# Patient Record
Sex: Female | Born: 1965 | Race: White | Hispanic: Yes | Marital: Married | Smoking: Never smoker
Health system: Southern US, Community
[De-identification: ages and names within clinical notes are randomized; demographics above are authoritative.]

## PROBLEM LIST (undated history)

## (undated) DIAGNOSIS — E785 Hyperlipidemia, unspecified: Secondary | ICD-10-CM

## (undated) DIAGNOSIS — G4700 Insomnia, unspecified: Secondary | ICD-10-CM

## (undated) DIAGNOSIS — D219 Benign neoplasm of connective and other soft tissue, unspecified: Secondary | ICD-10-CM

## (undated) DIAGNOSIS — E039 Hypothyroidism, unspecified: Secondary | ICD-10-CM

## (undated) HISTORY — DX: Benign neoplasm of connective and other soft tissue, unspecified: D21.9

## (undated) HISTORY — PX: DILATION AND CURETTAGE OF UTERUS: SHX78

## (undated) HISTORY — DX: Hypothyroidism, unspecified: E03.9

---

## 2000-03-28 ENCOUNTER — Other Ambulatory Visit: Admission: RE | Admit: 2000-03-28 | Discharge: 2000-03-28 | Payer: Self-pay | Admitting: Gynecology

## 2000-03-30 ENCOUNTER — Encounter: Payer: Self-pay | Admitting: Gynecology

## 2000-03-30 ENCOUNTER — Ambulatory Visit (HOSPITAL_COMMUNITY): Admission: RE | Admit: 2000-03-30 | Discharge: 2000-03-30 | Payer: Self-pay | Admitting: Gynecology

## 2000-04-01 ENCOUNTER — Encounter: Payer: Self-pay | Admitting: Gynecology

## 2000-04-01 ENCOUNTER — Ambulatory Visit (HOSPITAL_COMMUNITY): Admission: RE | Admit: 2000-04-01 | Discharge: 2000-04-01 | Payer: Self-pay | Admitting: Gynecology

## 2001-04-06 ENCOUNTER — Other Ambulatory Visit: Admission: RE | Admit: 2001-04-06 | Discharge: 2001-04-06 | Payer: Self-pay | Admitting: Gynecology

## 2001-10-31 ENCOUNTER — Emergency Department (HOSPITAL_COMMUNITY): Admission: EM | Admit: 2001-10-31 | Discharge: 2001-11-01 | Payer: Self-pay | Admitting: Emergency Medicine

## 2001-11-01 ENCOUNTER — Encounter: Payer: Self-pay | Admitting: Emergency Medicine

## 2002-06-16 ENCOUNTER — Encounter (INDEPENDENT_AMBULATORY_CARE_PROVIDER_SITE_OTHER): Payer: Self-pay | Admitting: *Deleted

## 2002-06-16 LAB — CONVERTED CEMR LAB

## 2002-12-13 ENCOUNTER — Other Ambulatory Visit: Admission: RE | Admit: 2002-12-13 | Discharge: 2002-12-13 | Payer: Self-pay | Admitting: Gynecology

## 2003-03-14 ENCOUNTER — Emergency Department (HOSPITAL_COMMUNITY): Admission: EM | Admit: 2003-03-14 | Discharge: 2003-03-14 | Payer: Self-pay | Admitting: Emergency Medicine

## 2003-08-26 ENCOUNTER — Encounter: Admission: RE | Admit: 2003-08-26 | Discharge: 2003-08-26 | Payer: Self-pay | Admitting: Sports Medicine

## 2003-08-29 ENCOUNTER — Encounter: Admission: RE | Admit: 2003-08-29 | Discharge: 2003-08-29 | Payer: Self-pay | Admitting: Family Medicine

## 2003-09-09 ENCOUNTER — Encounter: Admission: RE | Admit: 2003-09-09 | Discharge: 2003-09-09 | Payer: Self-pay | Admitting: Family Medicine

## 2004-09-18 ENCOUNTER — Other Ambulatory Visit: Admission: RE | Admit: 2004-09-18 | Discharge: 2004-09-18 | Payer: Self-pay | Admitting: Gynecology

## 2004-10-08 ENCOUNTER — Encounter: Admission: RE | Admit: 2004-10-08 | Discharge: 2004-10-08 | Payer: Self-pay | Admitting: Gynecology

## 2004-12-24 ENCOUNTER — Emergency Department (HOSPITAL_COMMUNITY): Admission: EM | Admit: 2004-12-24 | Discharge: 2004-12-24 | Payer: Self-pay | Admitting: Emergency Medicine

## 2006-04-14 DIAGNOSIS — E669 Obesity, unspecified: Secondary | ICD-10-CM | POA: Insufficient documentation

## 2006-04-14 DIAGNOSIS — E739 Lactose intolerance, unspecified: Secondary | ICD-10-CM | POA: Insufficient documentation

## 2006-04-15 ENCOUNTER — Encounter (INDEPENDENT_AMBULATORY_CARE_PROVIDER_SITE_OTHER): Payer: Self-pay | Admitting: *Deleted

## 2009-01-03 ENCOUNTER — Ambulatory Visit (HOSPITAL_COMMUNITY): Admission: RE | Admit: 2009-01-03 | Discharge: 2009-01-03 | Payer: Self-pay | Admitting: Obstetrics & Gynecology

## 2010-02-17 ENCOUNTER — Ambulatory Visit (HOSPITAL_COMMUNITY)
Admission: RE | Admit: 2010-02-17 | Discharge: 2010-02-17 | Payer: Self-pay | Source: Home / Self Care | Attending: Obstetrics & Gynecology | Admitting: Obstetrics & Gynecology

## 2011-05-05 ENCOUNTER — Other Ambulatory Visit (HOSPITAL_COMMUNITY): Payer: Self-pay | Admitting: Geriatric Medicine

## 2011-05-05 DIAGNOSIS — Z1231 Encounter for screening mammogram for malignant neoplasm of breast: Secondary | ICD-10-CM

## 2011-06-03 ENCOUNTER — Ambulatory Visit (HOSPITAL_COMMUNITY)
Admission: RE | Admit: 2011-06-03 | Discharge: 2011-06-03 | Disposition: A | Discharge status: Home / Self Care | Payer: Self-pay | Source: Ambulatory Visit | Attending: Geriatric Medicine | Admitting: Geriatric Medicine

## 2011-06-03 DIAGNOSIS — Z1231 Encounter for screening mammogram for malignant neoplasm of breast: Secondary | ICD-10-CM

## 2011-06-07 ENCOUNTER — Other Ambulatory Visit: Payer: Self-pay | Admitting: Geriatric Medicine

## 2011-06-07 DIAGNOSIS — R928 Other abnormal and inconclusive findings on diagnostic imaging of breast: Secondary | ICD-10-CM

## 2011-07-02 ENCOUNTER — Ambulatory Visit
Admission: RE | Admit: 2011-07-02 | Discharge: 2011-07-02 | Disposition: A | Discharge status: Home / Self Care | Payer: Self-pay | Source: Ambulatory Visit | Attending: Geriatric Medicine | Admitting: Geriatric Medicine

## 2011-07-02 DIAGNOSIS — R928 Other abnormal and inconclusive findings on diagnostic imaging of breast: Secondary | ICD-10-CM

## 2012-01-03 ENCOUNTER — Emergency Department (HOSPITAL_COMMUNITY)
Admission: EM | Admit: 2012-01-03 | Discharge: 2012-01-03 | Disposition: A | Discharge status: Home / Self Care | Payer: Self-pay | Attending: Emergency Medicine | Admitting: Emergency Medicine

## 2012-01-03 ENCOUNTER — Encounter (HOSPITAL_COMMUNITY): Payer: Self-pay | Admitting: Adult Health

## 2012-01-03 DIAGNOSIS — N390 Urinary tract infection, site not specified: Secondary | ICD-10-CM | POA: Insufficient documentation

## 2012-01-03 DIAGNOSIS — R109 Unspecified abdominal pain: Secondary | ICD-10-CM | POA: Insufficient documentation

## 2012-01-03 LAB — URINE MICROSCOPIC-ADD ON

## 2012-01-03 LAB — URINALYSIS, ROUTINE W REFLEX MICROSCOPIC
Glucose, UA: NEGATIVE mg/dL
Protein, ur: 100 mg/dL — AB

## 2012-01-03 MED ORDER — PHENAZOPYRIDINE HCL 200 MG PO TABS: 200.0000 mg | ORAL_TABLET | Freq: Three times a day (TID) | ORAL | Status: DC

## 2012-01-03 MED ORDER — NITROFURANTOIN MONOHYD MACRO 100 MG PO CAPS: 100.0000 mg | ORAL_CAPSULE | Freq: Two times a day (BID) | ORAL | Status: DC

## 2012-01-03 NOTE — ED Notes (Addendum)
Presents with burning with urination, lower abdominal pain that feels like a pressure, frequent urination, inability to empty bladder fully. Denies hematuria. Urinary symptoms began Friday. Denies discharge, foul odor.  Pain is worse when standing up and is better when sitting.

## 2012-01-03 NOTE — ED Provider Notes (Signed)
History   This chart was scribed for Loren Racer, MD by Gerlean Ren, ED Scribe. This patient was seen in room TR08C/TR08C and the patient's care was started at 7:55 PM    CSN: [id number]  Arrival date & time 01/03/12  1756   First MD Initiated Contact with Patient 01/03/12 1946      Chief Complaint  Patient presents with  . Dysuria    (Consider location/radiation/quality/duration/timing/severity/associated sxs/prior treatment) The history is provided by the patient and a relative. A language interpreter was used.   Dawn Villegas is a 46 y.o. female who presents to the Emergency Department complaining of 3 days of dysuria with associated lower abdominal pain that has been worsening since this morning causing pt to leave work.  Pt denies hematuria, urgency, frequency, fever and chills as associated symptoms.  Pt reports h/o UTI "long time ago."  Pt denies tobacco and alcohol use.   History reviewed. No pertinent past medical history.  History reviewed. No pertinent past surgical history.  History reviewed. No pertinent family history.  History  Substance Use Topics  . Smoking status: Never Smoker   . Smokeless tobacco: Not on file  . Alcohol Use: No    No OB history provided.  Review of Systems  Constitutional: Negative for fever and chills.  Gastrointestinal: Negative for nausea, vomiting and abdominal pain.  Genitourinary: Positive for dysuria. Negative for frequency, hematuria, flank pain, decreased urine volume and vaginal discharge.    Allergies  Review of patient's allergies indicates no known allergies.  Home Medications   Current Outpatient Rx  Name  Route  Sig  Dispense  Refill  . NITROFURANTOIN MONOHYD MACRO 100 MG PO CAPS   Oral   Take 1 capsule (100 mg total) by mouth 2 (two) times daily.   10 capsule   0   . PHENAZOPYRIDINE HCL 200 MG PO TABS   Oral   Take 1 tablet (200 mg total) by mouth 3 (three) times daily.   6 tablet   0     BP  116/67  Pulse 81  Temp 98.8 F (37.1 C) (Oral)  Resp 16  SpO2 99%  Physical Exam  Nursing note and vitals reviewed. Constitutional: She is oriented to person, place, and time. She appears well-developed and well-nourished.  HENT:  Head: Normocephalic and atraumatic.  Eyes: Conjunctivae normal and EOM are normal. Pupils are equal, round, and reactive to light.  Neck: Normal range of motion. Neck supple.  Cardiovascular: Normal rate, regular rhythm and normal heart sounds.   Pulmonary/Chest: Effort normal and breath sounds normal.  Abdominal: Soft. Bowel sounds are normal. She exhibits no distension.       Mild suprapubic tenderness.  Musculoskeletal: Normal range of motion.       Mild bilateral paraspinal tenderness.  No CVA tenderness.  Neurological: She is alert and oriented to person, place, and time.  Skin: Skin is warm and dry.  Psychiatric: She has a normal mood and affect.    ED Course  Procedures (including critical care time) DIAGNOSTIC STUDIES: Oxygen Saturation is 99% on room air, normal by my interpretation.    COORDINATION OF CARE: 7:57 PM- Patient informed of clinical course, understands medical decision-making process, and agrees with plan.            Results for orders placed during the hospital encounter of 01/03/12  URINALYSIS, ROUTINE W REFLEX MICROSCOPIC      Component Value Range   Color, Urine AMBER (*) YELLOW   APPearance  CLOUDY (*) CLEAR   Specific Gravity, Urine 1.014  1.005 - 1.030   pH 6.0  5.0 - 8.0   Glucose, UA NEGATIVE  NEGATIVE mg/dL   Hgb urine dipstick MODERATE (*) NEGATIVE   Bilirubin Urine NEGATIVE  NEGATIVE   Ketones, ur 15 (*) NEGATIVE mg/dL   Protein, ur 191 (*) NEGATIVE mg/dL   Urobilinogen, UA 1.0  0.0 - 1.0 mg/dL   Nitrite POSITIVE (*) NEGATIVE   Leukocytes, UA LARGE (*) NEGATIVE  URINE MICROSCOPIC-ADD ON      Component Value Range   Squamous Epithelial / LPF RARE  RARE   WBC, UA TOO NUMEROUS TO COUNT  <3 WBC/hpf    RBC / HPF 11-20  <3 RBC/hpf   Bacteria, UA MANY (*) RARE   Casts HYALINE CASTS (*) NEGATIVE   Urine-Other MUCOUS PRESENT      No results found.   1. UTI (urinary tract infection)       MDM  I personally performed the services described in this documentation, which was scribed in my presence. The recorded information has been reviewed and is accurate.       Loren Racer, MD 01/03/12 2032

## 2012-01-05 LAB — URINE CULTURE

## 2012-01-06 NOTE — ED Notes (Signed)
+   Urine] Patient treated with Macrobid-sensitive to same-chart appended per protocol MD. 

## 2012-12-15 ENCOUNTER — Ambulatory Visit: Payer: Self-pay | Admitting: Gynecology

## 2013-01-01 ENCOUNTER — Encounter: Payer: Self-pay | Admitting: Gynecology

## 2013-01-01 ENCOUNTER — Ambulatory Visit (INDEPENDENT_AMBULATORY_CARE_PROVIDER_SITE_OTHER): Payer: 59 | Admitting: Gynecology

## 2013-01-01 ENCOUNTER — Other Ambulatory Visit (HOSPITAL_COMMUNITY)
Admission: RE | Admit: 2013-01-01 | Discharge: 2013-01-01 | Disposition: A | Discharge status: Home / Self Care | Payer: 59 | Source: Ambulatory Visit | Attending: Gynecology | Admitting: Gynecology

## 2013-01-01 VITALS — BP 128/88 | Ht 61.0 in | Wt 134.0 lb

## 2013-01-01 DIAGNOSIS — Z01419 Encounter for gynecological examination (general) (routine) without abnormal findings: Secondary | ICD-10-CM

## 2013-01-01 DIAGNOSIS — Z833 Family history of diabetes mellitus: Secondary | ICD-10-CM

## 2013-01-01 DIAGNOSIS — J209 Acute bronchitis, unspecified: Secondary | ICD-10-CM

## 2013-01-01 DIAGNOSIS — Z1151 Encounter for screening for human papillomavirus (HPV): Secondary | ICD-10-CM | POA: Insufficient documentation

## 2013-01-01 MED ORDER — HYDROCOD POLST-CPM POLST ER 10-8 MG PO CP12: 10.0000 mg | ORAL_CAPSULE | Freq: Two times a day (BID) | ORAL | Status: DC

## 2013-01-01 MED ORDER — CEFUROXIME AXETIL 250 MG PO TABS: 250.0000 mg | ORAL_TABLET | Freq: Two times a day (BID) | ORAL | Status: DC

## 2013-01-01 NOTE — Patient Instructions (Addendum)
Vacuna contra el ttanos y la difteria (Td), Lo que debe saber  (Tetanus, Diphtheria (Td) Vaccine, What You Need to Know) PORQU VACUNARSE?  El ttanos  y la difteria son enfermedades muy graves. Actualmente son raras en los [place], pero las personas que se infectan suelen tener complicaciones graves. La vacuna Td se utiliza para proteccin contra estas enfermedades en adolescentes y adultos. Tanto el ttanos como la difteria son infecciones causadas por bacterias. La difteria se transmite de persona a persona a travs de la tos o el estornudo. El ttanos ingresa al organismo a travs de cortes, rasguos o heridas.  (Trismo) provoca la contraccin dolorosa de los msculos, por lo general, en todo el cuerpo.   Puede causar el endurecimiento de los msculos de la cabeza y el cuello, de modo que impide abrir la boca, tragar y en algunos casos, respirar. El ttanos causa la muerte de 1 de cada 5 personas que se infectan. La DIFTERIA hace que se forme una sustancia espesa en el fondo de la garganta.   Puede causar problemas respiratorios, parlisis, insuficiencia cardaca e incluso la muerte. Antes de las vacunas, en los [place] se vieron ms de 200.000 casos al ao de difteria y cientos de casos de ttanos. Desde que comenz la vacunacin, el nmero de casos en ambas enfermedades ha disminuido en un 99%.  VACUNA TD  La vacuna Tdap protege a adolescentes y adultos contra el ttanos y la difteria. La Td generalmente se administra como refuerzo cada 10 aos pero tambin puede aplicarse antes luego de sufrir una herida o quemadura sucia y grave.  El mdico le dar ms informacin.  La Td puede administrarse de manera segura simultneamente con otras vacunas.  ALGUNAS PERSONAS NO DEBEN RECIBIR ESTA VACUNA  Si alguna vez tuvo una reaccin alrgica potencialmente mortal despus de una dosis de la vacuna contra el ttanos, la difteria o la tos ferina, o tuvo una alergia grave a cualquiera de  los componentes de esta vacuna, no debe aplicarse la vacuna. Informe a su mdico si usted sufre algn tipo de alergia grave.  Consulte con su mdico si:  tiene epilepsia u otra enfermedad del sistema nervioso,  siente dolor intenso o se hincha despus de recibir cualquier vacuna contra la difteria, el ttanos o la tos ferina,  ha tenido el sndrome de Guillain Barr (GBS por sus siglas en ingls).  no se siente bien el da en que se ha programado la vacuna. RIESGOS DE UNA REACCIN A LA VACUNA Con la vacuna, como cualquier medicamento, existe la posibilidad de sufrir efectos secundarios. Suelen ser leves y desaparecen por s solos.  Los efectos secundarios graves son tambin posibles, pero son muy raros.  La mayora de las personas a los que se aplica esta vacuna no tienen ningn problema.  Problemas leves  luego de aplicarse la Td  (no han interferido con las actividades)   Dolor en el lugar de la inyeccin (8 de cada 10 personas).  Enrojecimiento o hinchazn en el lugar de la inyeccin (1 de cada 3 personas).  Dolor de cabeza (alrededor de 1 cada 15 personas).  Dolor de cabeza o cansancio (poco frecuente). Problemas moderados  luego de aplicarse la Td  (han interferido con las actividades, pero no requieren atencin mdica)   La temperatura es de ms de 102 F (38.9 C). Problemas graves  luego de aplicarse la Td  (no puede realizar las actividades habituales, requiere atencin mdica)   Inflamacin, dolor intenso, sangrado o irritacin   en el brazo, en el sitio de la inyeccin (poco frecuente). Problemas que podran ocurrir despus de cualquier vacuna:   Despus de cualquier procedimiento mdico pueden ocurrir [employer], incluso despus de aplicarse una vacuna. Para prevenir desmayos y lesiones causadas por la cada puede sentarse o recostarse por unos 15 minutos. Informe a su mdico si se siente mareado, tiene cambios en la visin o zumbidos en los odos.  En raras  ocasiones puede haber dolor intenso en el hombro y limitacin de la amplitud de movimientos del brazo en el que le aplicaron la vacunas.  Las Therapist, art graves a la vacuna son Lynnae Sandhoff raras y se estima en menos de 1 en un milln de dosis. Si ocurriera, sera dentro de unos pocos minutos o unas pocas horas de haberse vacunado. QU PASA SI HAY UNA REACCIN GRAVE?  Qu signos debo buscar?  Observe todo lo que le preocupe, como signos de una reaccin alrgica grave, fiebre muy alta o cambios en el comportamiento. Los signos de Runner, broadcasting/film/video grave pueden incluir urticaria, hinchazn de la cara y la garganta, dificultad para respirar, ritmo cardaco acelerado, mareos y debilidad. Generalmente comienzan entre unos pocos minutos y algunas horas despus de la vacunacin.  Qu debo hacer?  Si usted piensa que se trata de una reaccin alrgica grave o de otra emergencia que no puede esperar, llame al 911 o llvelo al hospital ms cercano. De lo contrario, llame a su mdico.  Despus, la reaccin debe informarse a la "Vaccine Adverse Event Reporting System" Sistema de informacin sobre efectos adversos de las vacunas (VAERS). Su mdico puede presentar este informe, o puede hacerlo usted mismo a travs del sitio web de VAERS o llamando al [phone]. El VAERS es slo para Biomedical engineer. No brindan consejo mdico. PROGRAMA NACIONAL DE COMPENSACIN DE DAOS POR VACUNAS  El National Vaccine Injury Compensation Program (VICP) es un programa federal que fue creado para compensar a las personas que puedan haber sufrido daos al recibir ciertas vacunas.  Aquellas personas que consideren que han sufrido un dao como consecuencia de una vacuna y quieren saber ms acerca del programa y como presentar [place], [place] llamar al [phone] o visitar su sitio web del VICP.  CMO PUEDO OBTENER MS INFORMACIN?   Consulte a su mdico.  Pngase en contacto con el servicio de salud de su  localidad o su estado.  Comunquese con los Centros para el control y la prevencin de Child psychotherapist for Disease Control and Prevention, CDC).  Llame al [phone] (1-800-CDC-INFO)  Visite el sitio web del CDC. CDC Inactivated Influenza Vaccine Interim VIS (03/21/12)  Document Released: 05/20/2008 Document Revised: 05/29/2012 ExitCare Patient Information 2014 [place], [place].                                                     Control del colesterol  Los niveles de colesterol en el organismo estn determinados significativamente por su dieta. Los niveles de colesterol tambin se relacionan con la enfermedad cardaca. El material que sigue ayuda a Software engineer relacin y a Chiropractor qu puede hacer para mantener su corazn sano. No todo el colesterol es [place]. Las lipoprotenas de baja densidad (LDL) forman el colesterol "malo". El colesterol malo puede ocasionar depsitos de grasa que se acumulan en el interior de las arterias. Las lipoprotenas de alta densidad (HDL)  es el colesterol "bueno". Ayuda a remover el colesterol LDL "malo" de la [place]. El colesterol es un factor de riesgo muy importante para la enfermedad cardaca. Otros factores de riesgo son la hipertensin arterial, el hbito de fumar, el estrs, la herencia y [place].   El msculo cardaco obtiene el suministro de sangre a travs de las arterias coronarias. Si su colesterol LDL ("malo") est elevado y el HDL ("bueno") es bajo, tiene un factor de riesgo para que se formen depsitos de Holiday representative en las arterias coronarias (los vasos sanguneos que suministran sangre al corazn). Esto hace que haya menos lugar para que la sangre circule. Sin la suficiente sangre y oxgeno, el msculo cardaco no puede funcionar correctamente, y usted podr sentir dolores en el pecho (angina pectoris). Cuando una arteria coronaria se cierra completamente, una parte del msculo cardaco puede morir (infarto de miocardio).  CONTROL DEL  COLESTEROL Cuando el profesional que lo asiste enva la sangre al laboratorio para Artist nivel de colesterol, puede realizarle tambin un perfil completo de los lpidos. Con esta prueba, se puede determinar la cantidad total de colesterol, as como los niveles de LDL y HDL. Los triglicridos son un tipo de grasa que circula en la sangre y que tambin puede utilizarse para determinar el riesgo de enfermedad cardaca. En la siguiente tabla se establecen los nmeros ideales: Prueba: Colesterol total  Menos de 200 mg/dl.  Prueba: LDL "colesterol malo"  Menos de 100 mg/dl.   Menos de 70 mg/dl si tiene riesgo muy elevado de sufrir un ataque cardaco o muerte cardaca sbita.  Prueba: HDL "colesterol bueno"  Mujeres: Ms de 50 mg/dl.   Hombres: Ms de 40 mg/dl.  Prueba: Trigliceridos  Menos de 150 mg/dl.    CONTROL DEL COLESTEROL CON DIETA Aunque factores como el ejercicio y el estilo de vida son importantes, la "primera lnea de ataque" es la dieta. Esto se debe a que se sabe que ciertos alimentos hacen subir el colesterol y otros lo [place]. El objetivo debe ser [employer] alimentos, de modo que tengan un efecto sobre el colesterol y, an ms importante, Microbiologist las grasas saturadas y trans con otros tipos de grasas, como las monoinsaturadas y las poliinsaturadas y cidos grasos omega-3 . En promedio, una persona no debe consumir ms de 15 a 17 g de grasas saturadas por [employer]. Las grasas saturadas y trans se consideran grasas "malas", ya que elevan el colesterol LDL. Las grasas saturadas se encuentran principalmente en productos animales como carne, [place] y crema. Pero esto no significa que usted Marketing executive todas sus comidas favoritas. Actualmente, como lo muestra el cuadro que figura al final de este documento, hay sustitutos de buen sabor, bajos en grasas y en colesterol, para la mayora de los alimentos que a usted Musician. Elija aquellos alimentos alternativos que sean  bajos en grasas o sin grasas. Elija cortes de carne del cuarto trasero o lomo ya que estos cortes son los que tienen menor cantidad de grasa y Oncologist. El pollo (sin piel), el pescado, la carne de ternera, y la [place] de [place] molida son excelentes opciones. Elimine las carnes [employer] Foods o el salami. Los [employer] o nada de grasas saturadas. Cuando consuma carne [place], carne de aves de corral, o pescado, hgalo en porciones de 85 gramos (3 onzas). Las grasas trans tambin se llaman "aceites parcialmente hidrogenados". Son aceites manipulados cientficamente de [place] que son slidos a Publishing rights manager, tienen una larga vida y [place]  el sabor y la textura de los alimentos a los que se Scientist, clinical (histocompatibility and immunogenetics). Las grasas trans se encuentran en la [place], [place], crackers y alimentos horneados.  Para hornear y cocinar, el aceite es un excelente sustituto para la [place]. Los aceites monoinsaturados tienen un beneficio particular, ya que se cree que disminuyen el colesterol LDL (colesterol malo) y elevan el HDL. Deber evitar los aceites tropicales saturados como el de coco y el de [place].  Recuerde, adems, que puede comer sin restricciones los grupos de alimentos que son naturalmente libres de grasas saturadas y Neurosurgeon trans, entre los que se incluyen el pescado, las frutas (excepto el [place]), verduras, frijoles, cereales (cebada, arroz, [place], trigo) y las pastas (sin salsas con crema)   IDENTIFIQUE LOS ALIMENTOS QUE DISMINUYEN EL COLESTEROL  Pueden disminuir el colesterol las fibras solubles que estn en las frutas, como las [place], en los vegetales como el brcoli, las patatas y las zanahorias; en las legumbres como frijoles, guisantes y Therapist, occupational; y en los cereales como la cebada. Los alimentos fortificados con fitosteroles tambin Engineer, production. Debe consumir al menos 2 g de estos alimentos a diario para Financial planner de disminucin de [place].  En el  supermercado, lea las etiquetas de los envases para identificar los alimentos bajos en grasas saturadas, libres de grasas trans y bajos en [place], . Elija quesos que tengan solo de 2 a 3 g de grasa saturada por onza (28,35 g). Use una margarina que no dae el corazn, [place] de grasas trans o aceite parcialmente hidrogenado. Al comprar alimentos horneados (galletitas dulces y Gaffer) evite el aceite parcialmente hidrogenado. Los panes y bollos debern ser de granos enteros (harina de maz o de avena entera, en lugar de "harina" o "harina enriquecida"). Compre sopas en lata que no sean cremosas, con bajo contenido de sal y sin grasas adicionadas.   TCNICAS DE PREPARACIN DE LOS ALIMENTOS  Nunca fra los alimentos en aceite abundante. Si debe frer, hgalo en poco aceite y removiendo [place], porque as se utilizan muy pocas grasas, o utilice un spray antiadherente. Cuando le sea posible, hierva, hornee o ase las carnes y cocine los vegetales al vapor. En vez de [employer] con mantequilla o [place], utilice limn y hierbas, pur de Psychologist, educational y canela (para las calabazas y batatas), yogurt y salsa descremados y aderezos para ensaladas bajos en contenido graso.   BAJO EN GRASAS SATURADAS / SUSTITUTOS BAJOS EN GRASA  Carnes / Grasas saturadas (g)  Evite: Bife, corte graso (3 oz/85 g) / 11 g   Elija: Bife, corte magro (3 oz/85 g) / 4 g   Evite: Hamburguesa (3 oz/85 g) / 7 g   Elija:  [place] magra (3 oz/85 g) / 5 g   Evite: Jamn (3 oz/85 g) / 6 g   Elija:  Jamn magro (3 oz/85 g) / 2.4 g   Evite: Pollo, con piel (3 oz/85 g), Carne oscura / 4 g   Elija:  Pollo, sin piel (3 oz/85 g), Carne oscura / 2 g   Evite: Pollo, con piel (3 oz/85 g), Carne magra / 2.5 g   Elija: Pollo, sin piel (3 oz/85 g), Carne magra / 1 g  Lcteos / Grasas saturadas (g)  Evite: Leche entera (1 taza) / 5 g   Elija: Leche con bajo contenido de grasa, 2% (1 taza) / 3 g   Elija: Leche con bajo  contenido de grasa, 1% (1 taza) / 1.5 g   Elija: Leche descremada (  1 taza) / 0.3 g   Evite: Queso duro (1 oz/28 g) / 6 g   Elija: Queso descremado (1 oz/28 g) / 2-3 g   Evite: Queso cottage, 4% grasa (1 taza)/ 6.5 g   Elija: Queso cottage con bajo contenido de grasa, 1% grasa (1 taza)/ 1.5 g   Evite: Helado (1 taza) / 9 g   Elija: Sorbete (1 taza) / 2.5 g   Elija: Yogurt helado sin contenido de grasa (1 taza) / 0.3 g   Elija: Barras de fruta congeladas / vestigios   Evite: Crema batida (1 cucharada) / 3.5 g   Elija: Batidos glac sin lcteos (1 cucharada) / 1 g  Condimentos / Grasas saturadas (g)  Evite: Mayonesa (1 cucharada) / 2 g   Elija: Mayonesa con bajo contenido de grasa (1 cucharada) / 1 g   Evite: [place] (1 cucharada) / 7 g   Elija: Margarina extra light (1 cucharada) / 1 g   Evite: Aceite de coco (1 cucharada) / 11.8 g   Elija: Aceite de oliva (1 cucharada) / 1.8 g   Elija: Aceite de maz (1 cucharada) / 1.7 g   Elija: Aceite de crtamo (1 cucharada) / 1.2 g   Elija: Aceite de girasol (1 cucharada) / 1.4 g   Elija: Aceite de soja (1 cucharada) / 2.4 g   Elija: Aceite de canola (1 cucharada) / 1 g  Document Released: 02/01/2005 Document Revised: 10/14/2010 [facility] Patient Information 2012 [place], [place]. Bronquitis (Bronchitis) La bronquitis es [employer] (el modo que tiene el organismo de Publishing rights manager a una lesin o infeccin) de los bronquios Los bronquios son los conductos que se extienden desde la trquea [employer]. Si la inflamacin se agrava, puede causar la falta de aire. CAUSAS Las causas de la inflamacin pueden ser:  Un virus  Grmenes (bacteria).  Polvo  Alergenos  La polucin y muchos otros irritantes Las clulas que revisten el rbol bronquial estn cubiertas con pequeos pelos (cilias). Esta constantemente producen un movimiento desde los pulmones hacia la boca. De este modo se mantienen los pulmones libres de  polucin. Cuando estas clulas se irritan y no pueden cumplir su funcin, comienza a formarse la mucosidad. Esto produce la caracterstica tos de la bronquitis. La tos es el mecanismo por el cual se limpian los pulmones cuando las cilias no pueden cumplir su funcin. Sin alguno de [employer] Corporation, Animator se Psychologist, clinical los pulmones Entonces se desarrollara una pulmona.  El fumar es una de las causas ms frecuentes de bronquitis y puede contribuir a la neumona. Abandonar este hbito es lo ms importante que puede hacer para beneficiarse. TRATAMIENTO  El [employer] prescribir antibiticos si la causa es una bacteria, y medicamentos para abrir las vas areas y Personnel officer. Tambin puede recomendar o prescribir un expectorante. El expectorante aflojar la mucosidad para que pueda eliminarla. Slo tome medicamentos de Sales promotion account executive o prescriptos para Primary school teacher, las [place], o bajar la fiebre segn las indicaciones de su mdico.  Pharmacologist todo lo que causa el problema (por ejemplo el hbito de Art therapist) es fundamental para evitar que empeore.  Un antitusgeno puede prescribirse para [employer] de la tos.  Podrn indicarle inhalantes para aliviar los sntomas actuales y ayudar a prevenir problemas futuros.  Aquellos que sufren bronquitis crnica (recurrente) puede ser necesaria la administracin de corticoides. SOLICITE ATENCIN MDICA INMEDIATAMENTE SI:  Durante el tratamiento observa que elimina esputo similar a pus (purulento).  Tiene fiebre.  Se siente cada vez ms enfermo.  Tiene cada vez ms dificultad para respirar, tiene ruidos al respirar o Company secretary. Es necesario buscar atencin mdica inmediata si es [place] persona de edad avanzada o sufre alguna otra enfermedad. ASEGURESE DE QUE:   Comprende estas instrucciones.  Controlar su enfermedad.  Solicitar ayuda inmediatamente si no mejora o si empeora. Document Released: 02/01/2005  Document Revised: 10/04/2012 [facility] Patient Information 2014 [place], [place].

## 2013-01-01 NOTE — Progress Notes (Signed)
Dawn Villegas 02-04-1966 [id number]   History:    47 y.o.  for annual gyn exam who has not been seen in the office in over 10 years. She's been complaining of a slightly productive cough and post nasal drip for the past week. She denies any high-grade temperature. Patient did state that her work they had done some basic screening test and she was informed cholesterol was elevated. She is not fasting today. Patient had a normal mammogram this year. Patient denies any past history of abnormal Pap smear. She reports normal cycles that last for 4 days here the patient not using any form of contraception.  Past medical history,surgical history, family history and social history were all reviewed and documented in the EPIC chart.  Gynecologic History Patient's last menstrual period was 12/12/2012. Contraception: none Last Pap: over 3 years ago?Mar[facility] Kitchen Results were: normal Last mammogram: 2014. Results were: normal  Obstetric History OB History  Gravida Para Term Preterm AB SAB TAB Ectopic Multiple Living  5 4   1 1    4     # Outcome Date GA Lbr Len/2nd Weight Sex Delivery Anes PTL Lv  5 SAB           4 PAR           3 PAR           2 PAR           1 PAR                ROS: A ROS was performed and pertinent positives and negatives are included in the history.  GENERAL: No fevers or chills. HEENT: No change in vision, no earache, sore throat or sinus congestion. NECK: No pain or stiffness. CARDIOVASCULAR: No chest pain or pressure. No palpitations. PULMONARY: complaining of productive cough and sinus congestionGASTROINTESTINAL: No abdominal pain, nausea, vomiting or diarrhea, melena or bright red blood per rectum. GENITOURINARY: No urinary frequency, urgency, hesitancy or dysuria. MUSCULOSKELETAL: No joint or muscle pain, no back pain, no recent trauma. DERMATOLOGIC: No rash, no itching, no lesions. ENDOCRINE: No polyuria, polydipsia, no heat or cold intolerance. No recent change in weight.  HEMATOLOGICAL: No anemia or easy bruising or bleeding. NEUROLOGIC: No headache, seizures, numbness, tingling or weakness. PSYCHIATRIC: No depression, no loss of interest in normal activity or change in sleep pattern.     Exam: chaperone present  BP 128/88  Ht 5\' 1"  (1.549 m)  Wt 134 lb (60.782 kg)  BMI 25.33 kg/m2  LMP 12/12/2012  Body mass index is 25.33 kg/(m^2).  General appearance : Well developed well nourished female. No acute distress HEENT: Neck supple, trachea midline, no carotid bruits, no thyroidmegaly,tender maxillary sinuses Lungs: mild inspiratory rhonchi lower lung fields Heart: Regular rate and rhythm, no murmurs or gallops Breast:Examined in sitting and supine position were symmetrical in appearance, no palpable masses or tenderness,  no skin retraction, no nipple inversion, no nipple discharge, no skin discoloration, no axillary or supraclavicular lymphadenopathy Abdomen: no palpable masses or tenderness, no rebound or guarding Extremities: no edema or skin discoloration or tenderness  Pelvic:  Bartholin, Urethra, Skene Glands: Within normal limits             Vagina: No gross lesions or discharge  Cervix: No gross lesions or discharge  Uterus  anteverted, normal size, shape and consistency, non-tender and mobile  Adnexa  Without masses or tenderness  Anus and perineum  normal   Rectovaginal  normal sphincter tone without palpated masses  or tenderness             Hemoccult not indicated     Assessment/Plan:  47 y.o. female for annual exam mild bronchitis will be started on Ceftin 250 mg twice a day for 7 days. Patient will return to the office in the fasting state for the following labs: CBC, fasting lipid profile, comprehensive metabolic panel, TSH, and urinalysis. Pap smear was done today. We discussed importance of calcium and vitamin D in regular exercise for osteoporosis prevention. Patient was to receive the flu vaccine at work. The patient's husband in the  process of getting a vasectomy.  Note: This dictation was prepared with  Dragon/digital dictation along withSmart phrase technology. Any transcriptional errors that result from this process are unintentional.   Dawn Edwards MD, 6:33 PM 01/01/2013

## 2013-01-09 ENCOUNTER — Other Ambulatory Visit: Payer: Self-pay | Admitting: Gynecology

## 2013-01-09 ENCOUNTER — Ambulatory Visit: Payer: 59

## 2013-01-09 ENCOUNTER — Telehealth: Payer: Self-pay | Admitting: *Deleted

## 2013-01-09 DIAGNOSIS — Z833 Family history of diabetes mellitus: Secondary | ICD-10-CM

## 2013-01-09 DIAGNOSIS — E7800 Pure hypercholesterolemia, unspecified: Secondary | ICD-10-CM

## 2013-01-09 DIAGNOSIS — Z01419 Encounter for gynecological examination (general) (routine) without abnormal findings: Secondary | ICD-10-CM

## 2013-01-09 LAB — COMPREHENSIVE METABOLIC PANEL
ALT: 10 U/L (ref 0–35)
Albumin: 4 g/dL (ref 3.5–5.2)
CO2: 26 mEq/L (ref 19–32)
Glucose, Bld: 89 mg/dL (ref 70–99)
Potassium: 4.1 mEq/L (ref 3.5–5.3)
Sodium: 140 mEq/L (ref 135–145)
Total Protein: 7 g/dL (ref 6.0–8.3)

## 2013-01-09 LAB — CBC WITH DIFFERENTIAL/PLATELET
Hemoglobin: 12.7 g/dL (ref 12.0–15.0)
Lymphocytes Relative: 28 % (ref 12–46)
Lymphs Abs: 2.1 10*3/uL (ref 0.7–4.0)
Monocytes Relative: 6 % (ref 3–12)
Neutro Abs: 4.7 10*3/uL (ref 1.7–7.7)
Neutrophils Relative %: 60 % (ref 43–77)
RBC: 4.22 MIL/uL (ref 3.87–5.11)
WBC: 7.7 10*3/uL (ref 4.0–10.5)

## 2013-01-09 LAB — LIPID PANEL
Cholesterol: 219 mg/dL — ABNORMAL HIGH (ref 0–200)
LDL Cholesterol: 145 mg/dL — ABNORMAL HIGH (ref 0–99)
Triglycerides: 103 mg/dL (ref <150)

## 2013-01-09 LAB — URINALYSIS W MICROSCOPIC + REFLEX CULTURE
Bilirubin Urine: NEGATIVE
Crystals: NONE SEEN
Leukocytes, UA: NEGATIVE
Protein, ur: NEGATIVE mg/dL
Specific Gravity, Urine: 1.02 (ref 1.005–1.030)
Urobilinogen, UA: 0.2 mg/dL (ref 0.0–1.0)

## 2013-01-09 NOTE — Telephone Encounter (Signed)
Dawn Villegas pt interpreter informed with getting OTC robitussin Mucinex DM is OTC as well. Pt will try one of the below.

## 2013-01-09 NOTE — Telephone Encounter (Signed)
See if her plan we'll cover for maximum strength Mucinex DM  Which she can take one tablet with water every 12 hours as needed. 30 tablets. If not she can tissues Robitussin over-the-counter

## 2013-01-09 NOTE — Telephone Encounter (Signed)
Pt was given Rx for Tussicaps 10-8 mg the Rx will be $800 out of pocket and no covered with her insurance. Pt was informed by pharmacy that the liquid would be covered. Please advise

## 2013-01-10 ENCOUNTER — Other Ambulatory Visit: Payer: Self-pay | Admitting: Gynecology

## 2013-01-10 DIAGNOSIS — R3129 Other microscopic hematuria: Secondary | ICD-10-CM

## 2013-01-19 ENCOUNTER — Encounter: Payer: Self-pay | Admitting: Anesthesiology

## 2013-01-26 ENCOUNTER — Other Ambulatory Visit: Payer: Self-pay | Admitting: Women's Health

## 2013-01-26 MED ORDER — LEVOTHYROXINE SODIUM 50 MCG PO TABS: 50.0000 ug | ORAL_TABLET | Freq: Every day | ORAL | Status: DC

## 2013-03-24 ENCOUNTER — Emergency Department (HOSPITAL_BASED_OUTPATIENT_CLINIC_OR_DEPARTMENT_OTHER)
Admission: EM | Admit: 2013-03-24 | Discharge: 2013-03-24 | Disposition: A | Discharge status: Home / Self Care | Payer: Worker's Compensation | Attending: Emergency Medicine | Admitting: Emergency Medicine

## 2013-03-24 ENCOUNTER — Encounter (HOSPITAL_BASED_OUTPATIENT_CLINIC_OR_DEPARTMENT_OTHER): Payer: Self-pay | Admitting: Emergency Medicine

## 2013-03-24 DIAGNOSIS — Y990 Civilian activity done for income or pay: Secondary | ICD-10-CM | POA: Insufficient documentation

## 2013-03-24 DIAGNOSIS — Y9389 Activity, other specified: Secondary | ICD-10-CM | POA: Insufficient documentation

## 2013-03-24 DIAGNOSIS — X500XXA Overexertion from strenuous movement or load, initial encounter: Secondary | ICD-10-CM | POA: Insufficient documentation

## 2013-03-24 DIAGNOSIS — IMO0002 Reserved for concepts with insufficient information to code with codable children: Secondary | ICD-10-CM | POA: Insufficient documentation

## 2013-03-24 DIAGNOSIS — Y9289 Other specified places as the place of occurrence of the external cause: Secondary | ICD-10-CM | POA: Insufficient documentation

## 2013-03-24 DIAGNOSIS — S79919A Unspecified injury of unspecified hip, initial encounter: Secondary | ICD-10-CM | POA: Insufficient documentation

## 2013-03-24 DIAGNOSIS — S79929A Unspecified injury of unspecified thigh, initial encounter: Secondary | ICD-10-CM

## 2013-03-24 DIAGNOSIS — E7800 Pure hypercholesterolemia, unspecified: Secondary | ICD-10-CM | POA: Insufficient documentation

## 2013-03-24 MED ORDER — OXYCODONE-ACETAMINOPHEN 5-325 MG PO TABS: 1.0000 | ORAL_TABLET | Freq: Four times a day (QID) | ORAL | Status: DC | PRN

## 2013-03-24 MED ORDER — IBUPROFEN 400 MG PO TABS
400.0000 mg | ORAL_TABLET | Freq: Once | ORAL | Status: AC
  Administered 2013-03-24: 400 mg via ORAL
  Filled 2013-03-24: qty 1

## 2013-03-24 MED ORDER — OXYCODONE-ACETAMINOPHEN 5-325 MG PO TABS
1.0000 | ORAL_TABLET | Freq: Once | ORAL | Status: AC
  Administered 2013-03-24: 1 via ORAL
  Filled 2013-03-24: qty 1

## 2013-03-24 NOTE — ED Notes (Signed)
Patient stated via language translator that urine drug screen not necessary

## 2013-03-24 NOTE — ED Notes (Signed)
While at work a box of dresses fell onto patient and while moving she thinks she pulled her right lower leg, the box did not hit her, when she was trying to move it pulled. Pain worse when she tries to ambulate.

## 2013-03-24 NOTE — ED Provider Notes (Signed)
CSN: [id number]     Arrival date & time 03/24/13  1735 History   First MD Initiated Contact with Patient 03/24/13 1743     No chief complaint on file.  (Consider location/radiation/quality/duration/timing/severity/associated sxs/prior Treatment) Patient is a 48 y.o. female presenting with leg pain. The history is provided by the patient.  Leg Pain Location:  Leg Time since incident:  3 hours Injury: no   Leg location:  R leg Pain details:    Quality:  Aching   Radiates to:  Does not radiate   Severity:  Mild   Onset quality:  Sudden   Duration:  3 hours   Timing:  Constant   Progression:  Unchanged Chronicity:  New Dislocation: no   Foreign body present:  No foreign bodies Prior injury to area:  No Relieved by:  Nothing Worsened by:  Nothing tried Ineffective treatments:  None tried Associated symptoms: stiffness   Associated symptoms: no back pain, no fatigue, no fever and no neck pain     Past Medical History  Diagnosis Date  . High cholesterol    History reviewed. No pertinent past surgical history. Family History  Problem Relation Age of Onset  . Hypertension Mother   . Hypertension Father   . Diabetes Father   . Heart disease Father    History  Substance Use Topics  . Smoking status: Never Smoker   . Smokeless tobacco: Never Used  . Alcohol Use: No   OB History   Grav Para Term Preterm Abortions TAB SAB Ect Mult Living   5 4   1  1   4      Review of Systems  Constitutional: Negative for fever and fatigue.  HENT: Negative for congestion and drooling.   Eyes: Negative for pain.  Respiratory: Negative for cough and shortness of breath.   Cardiovascular: Negative for chest pain.  Gastrointestinal: Negative for nausea, vomiting, abdominal pain and diarrhea.  Genitourinary: Negative for dysuria and hematuria.  Musculoskeletal: Positive for stiffness. Negative for back pain, gait problem and neck pain.  Skin: Negative for color change.  Neurological:  Negative for dizziness and headaches.  Hematological: Negative for adenopathy.  Psychiatric/Behavioral: Negative for behavioral problems.  All other systems reviewed and are negative.    Allergies  Review of patient's allergies indicates no known allergies.  Home Medications  No current outpatient prescriptions on file. BP 109/65  Pulse 61  Temp(Src) 98.6 F (37 C) (Oral)  Resp 16  Ht 5\' 2"  (1.575 m)  Wt 139 lb (63.05 kg)  BMI 25.42 kg/m2  SpO2 100% Physical Exam  Nursing note and vitals reviewed. Constitutional: She is oriented to person, place, and time. She appears well-developed and well-nourished.  HENT:  Head: Normocephalic and atraumatic.  Mouth/Throat: Oropharynx is clear and moist. No oropharyngeal exudate.  Eyes: Conjunctivae and EOM are normal. Pupils are equal, round, and reactive to light.  Neck: Normal range of motion. Neck supple.  Cardiovascular: Normal rate, regular rhythm, normal heart sounds and intact distal pulses.  Exam reveals no gallop and no friction rub.   No murmur heard. Pulmonary/Chest: Effort normal and breath sounds normal. No respiratory distress. She has no wheezes.  Abdominal: Soft. Bowel sounds are normal. There is no tenderness. There is no rebound and no guarding.  Musculoskeletal: Normal range of motion. She exhibits no edema and no tenderness.  Mild pain in the calf with dorsiflexion of the right foot.  Mild tenderness to palpation of the right calf.  Mild tenderness to  palpation of the right distal posterior thigh. Mild pain with extension of the knee.  2+ pulses in the distal lower extremities. Normal range of motion of bilateral lower extremities.  Neurological: She is alert and oriented to person, place, and time.  Skin: Skin is warm and dry.  Psychiatric: She has a normal mood and affect. Her behavior is normal.    ED Course  Procedures (including critical care time) Labs Review Labs Reviewed - No data to display Imaging  Review No results found.  EKG Interpretation   None       MDM   1. Leg strain    5:58 PM 48 y.o. female presents with right posterior thigh and calf pain which began while reaching to get a box off a shelf at work approximately 3 hours ago. She denies being hit by the boxer falling. Her vital signs are unremarkable and she appears well on exam. Likely muscle strain. Will treat symptomatically and reexamine. No need for imaging.   Pt feeling better, likely msk strain. Will rec RICE.  I have discussed the diagnosis/risks/treatment options with the patient and believe the pt to be eligible for discharge home to follow-up with pcp as needed. We also discussed returning to the ED immediately if new or worsening sx occur. We discussed the sx which are most concerning (e.g., worsening pain, inc swelling) that necessitate immediate return. Medications administered to the patient during their visit and any new prescriptions provided to the patient are listed below.  Medications given during this visit Medications  oxyCODONE-acetaminophen (PERCOCET/ROXICET) 5-325 MG per tablet 1 tablet (1 tablet Oral Given 03/24/13 1803)  ibuprofen (ADVIL,MOTRIN) tablet 400 mg (400 mg Oral Given 03/24/13 1803)    Discharge Medication List as of 03/24/2013  7:25 PM    START taking these medications   Details  oxyCODONE-acetaminophen (PERCOCET) 5-325 MG per tablet Take 1 tablet by mouth every 6 (six) hours as needed for moderate pain., Starting 03/24/2013, Until Discontinued, Print           Blanchard Kelch, MD 03/25/13 [phone]

## 2013-05-22 ENCOUNTER — Other Ambulatory Visit: Payer: Self-pay | Admitting: Gynecology

## 2013-05-22 DIAGNOSIS — R3129 Other microscopic hematuria: Secondary | ICD-10-CM

## 2013-08-03 ENCOUNTER — Ambulatory Visit: Payer: 59

## 2013-08-03 DIAGNOSIS — R3129 Other microscopic hematuria: Secondary | ICD-10-CM

## 2013-08-03 DIAGNOSIS — E7800 Pure hypercholesterolemia, unspecified: Secondary | ICD-10-CM

## 2013-08-03 LAB — LIPID PANEL
CHOLESTEROL: 235 mg/dL — AB (ref 0–200)
HDL: 75 mg/dL (ref >39)
LDL Cholesterol: 144 mg/dL — ABNORMAL HIGH (ref 0–99)
Total CHOL/HDL Ratio: 3.1 Ratio
Triglycerides: 78 mg/dL (ref <150)
VLDL: 16 mg/dL (ref 0–40)

## 2013-08-04 LAB — URINALYSIS W MICROSCOPIC + REFLEX CULTURE
BILIRUBIN URINE: NEGATIVE
CASTS: NONE SEEN
CRYSTALS: NONE SEEN
GLUCOSE, UA: NEGATIVE mg/dL
Hgb urine dipstick: NEGATIVE
Ketones, ur: NEGATIVE mg/dL
Leukocytes, UA: NEGATIVE
Nitrite: NEGATIVE
Protein, ur: NEGATIVE mg/dL
SPECIFIC GRAVITY, URINE: 1.027 (ref 1.005–1.030)
Urobilinogen, UA: 0.2 mg/dL (ref 0.0–1.0)
pH: 6 (ref 5.0–8.0)

## 2013-08-05 LAB — URINE CULTURE: Colony Count: >=100000

## 2013-09-06 ENCOUNTER — Ambulatory Visit: Payer: 59 | Admitting: Gynecology

## 2013-09-11 ENCOUNTER — Encounter: Payer: Self-pay | Admitting: Gynecology

## 2013-09-11 ENCOUNTER — Ambulatory Visit (INDEPENDENT_AMBULATORY_CARE_PROVIDER_SITE_OTHER): Payer: 59 | Admitting: Gynecology

## 2013-09-11 VITALS — BP 130/76

## 2013-09-11 DIAGNOSIS — E039 Hypothyroidism, unspecified: Secondary | ICD-10-CM

## 2013-09-11 DIAGNOSIS — R5381 Other malaise: Secondary | ICD-10-CM

## 2013-09-11 DIAGNOSIS — R635 Abnormal weight gain: Secondary | ICD-10-CM

## 2013-09-11 DIAGNOSIS — R5382 Chronic fatigue, unspecified: Secondary | ICD-10-CM | POA: Insufficient documentation

## 2013-09-11 DIAGNOSIS — E785 Hyperlipidemia, unspecified: Secondary | ICD-10-CM

## 2013-09-11 DIAGNOSIS — R5383 Other fatigue: Secondary | ICD-10-CM

## 2013-09-11 MED ORDER — ATORVASTATIN CALCIUM 10 MG PO TABS: 10.0000 mg | ORAL_TABLET | Freq: Every day | ORAL | Status: DC

## 2013-09-11 NOTE — Patient Instructions (Addendum)
Atorvastatin tablets Qu es este medicamento? La ATORVASTATINA es conocido como un inhibidor de la HMG-CoA reductasa o 'estatina'. Reduce el nivel de colesterol y triglicridos en la sangre. Este medicamento tambin puede reducir el riesgo de Insurance risk surveyor ataques cardiacos, derrame cerebral u otros problemas de la salud en pacientes que corren el riesgo de padecer una enfermedad cardiaca. Lucius Conn y cambios a su estilo de vida son a menudo utilizados con Fish farm manager. Este medicamento puede ser utilizado para otros usos; si tiene alguna pregunta consulte con su proveedor de atencin mdica o con su farmacutico. MARCAS COMERCIALES DISPONIBLES: Lipitor Qu le debo informar a mi profesional de la salud antes de tomar este medicamento? Necesita saber si usted presenta alguno de los siguientes problemas o situaciones: -consume bebidas alcohlicas con frecuencia -antecedentes de derrame cerebral, TIA -enfermedad renal -enfermedad heptica -dolores o debilidades musculares -otra condicin mdica -una reaccin alrgica o inusual a la atorvastatina, a otros medicamentos, alimentos, colorantes o conservadores -si est embarazada o buscando quedar embarazada -si est amamantando a un beb Cmo debo utilizar este medicamento? Tome este medicamento por va oral con un vaso de agua. Siga las instrucciones de la etiqueta del [place]. Puede tomar este medicamento con o sin alimentos. Tome sus dosis a intervalos regulares. No tome su medicamento con una frecuencia mayor a la indicada. Hable con su pediatra para informarse acerca del uso de este medicamento en nios. Aunque este medicamento ha sido recetado a nios tan menores como de 10 aos de edad para condiciones selectivas, las precauciones se aplican. Sobredosis: Pngase en contacto inmediatamente con un centro toxicolgico o una sala de urgencia si usted cree que haya tomado demasiado medicamento. ATENCIN: [employer] es solo para usted. No  comparta este medicamento con nadie. Qu sucede si me olvido de una dosis? Si olvida una dosis, tmela lo antes posible. Si es casi la hora de su dosis siguiente, tome slo esa dosis. No tome dosis adicionales o dobles. Qu puede interactuar con este medicamento? No tome esta medicina con ninguno de los siguientes medicamentos: -levadura roja de arroz -telaprevir -telitromicina -voriconazol Esta medicina tambin puede interactuar con los siguientes medicamentos: -alcohol -medicamentos antivirales para el VIH o SIDA -boceprevir -ciertos antibiticos, tales como Geophysicist/field seismologist, eritromicina, troleandomicina -ciertos medicamentos para el colesterol, tales como fenofibrato o gemfibrozil -cimetidina -claritromicina -colchicina -ciclosporina -digoxina -hormonas femeninas, como estrgenos, progestinas o pldoras anticonceptivas -jugo de toronja -medicamentos para las infecciones micticas, tales como fluconazol, itraconazol, quetoconazol -niacina -rifampicina -espironolactona Puede ser que esta lista no menciona todas las posibles interacciones. Informe a su profesional de [employer]	[place] de [employer] productos a base de hierbas, medicamentos de [place] o suplementos nutritivos que est tomando. Si usted fuma, consume bebidas alcohlicas o si utiliza drogas ilegales, indqueselo tambin a su profesional de [employer]	[place]. Algunas sustancias pueden interactuar con su medicamento. A qu debo estar atento al usar Coca[employer]a? Visite a su mdico o a su profesional de la salud para chequeos peridicos. Puede necesitar exmenes regulares para asegurarse de que el hgado est funcionando bien. Informe a su mdico o a su profesional de la salud tan pronto como pueda si tiene debilidad, sensibilidad o dolores musculares que no tienen explicacin, especialmente si tambin tiene fiebre y [place]. Su mdico o profesional de Technical sales engineer puede informarle que deje de tomar este medicamento si desarrolla  problemas musculares. Si sus problemas musculares no desaparecen despus de parar este medicamento, comunquese con su profesional de [employer]	[place]. Este medicamento es slo parte de un programa  integral para la salud de [employer]. Su mdico o dietista pueden sugerirle una dieta con bajo contenido de colesterol y de grasa para ayudarle. Evite el alcohol y Copy, y [place] un programa adecuado de ejercicios fsicos. No utilice este medicamento si est embarazada o amamantando. Existe la posibilidad de efectos secundarios graves a un beb sin nacer o a Oncologist. Para ms informacin hable con su farmacutico o su mdico. Este medicamento puede afectar su nivel de azcar en la sangre. Si tiene diabetes, consulte a su mdico o a su profesional de la salud antes de cambiar su dieta o la dosis de su medicamento para la diabetes. Si va a someterse a una operacin, informe a su profesional de la salud que est tomando Coca[employer]a. Qu efectos secundarios puedo tener al [employer] este medicamento? Efectos secundarios que debe informar a su mdico o a Barrister's clerk de la salud tan pronto como sea posible: -Chief of Staff como erupcin cutnea, picazn o urticarias, hinchazn de la cara, labios o lengua -orina de color amarillo oscuro -fiebre -dolores articulares -calambres, dolores musculares -enrojecimiento, formacin de ampollas, descamacin o distensin de la piel, inclusive dentro de la boca -dificultad para orinar o cambios en el volumen de orina -cansancio o debilidad inusual -color amarillento de ojos o piel Efectos secundarios que, por lo general, no requieren atencin mdica (debe informarlos a su mdico o a su profesional de la salud si persisten o si son molestos): -estreimiento -acidez de estmago -gas, dolor, Higher education careers adviser Puede ser que esta lista no menciona todos los posibles efectos secundarios. Comunquese a su mdico por asesoramiento mdico [employer].  Usted puede informar los efectos secundarios a la FDA por telfono al 1-800-FDA-1088. Dnde debo guardar mi medicina? Mantngala fuera del alcance de los nios. Gurdela a una Levi Strauss 20 y 48 grados C (45 y 28 grados F). Deseche todo el medicamento que no haya utilizado, despus de la fecha de vencimiento. ATENCIN: Este folleto es un resumen. Puede ser que no cubra toda la posible informacin. Si usted tiene preguntas acerca de esta medicina, consulte con su mdico, su farmacutico o su profesional de Technical sales engineer.  2015, Elsevier/Gold Standard. (2010-12-25 17:43:43) Colesterol elevado (High Cholesterol) El trmino colesterol elevado hace referencia a una concentracin alta de colesterol en la sangre. El colesterol es una protena blanca y cerosa parecida a la grasa que el organismo necesita en pequeas cantidades. El hgado fabrica todo el colesterol que necesita. El exceso de colesterol proviene de los alimentos que come. El colesterol viaja por el torrente sanguneo hasta los vasos sanguneos. Si tiene el colesterol elevado, este puede depositarse (placa) en las paredes de los vasos sanguneos, lo que ocasiona el estrechamiento y la rigidez de las arterias. La placa aumenta el riesgo de ataque cardaco y de ictus. Trabaje con el mdico para [employer] concentraciones de colesterol en un rango saludable. FACTORES DE RIESGO Existen varios factores que pueden aumentar la probabilidad de que tenga colesterol elevado. Estos incluyen:   Consumir alimentos con alto contenido de grasa animal (grasa saturada) o colesterol.  Tener sobrepeso.  No hacer suficiente ejercicio fsico.  Tener antecedentes familiares de colesterol elevado. SIGNOS Y SNTOMAS El colesterol elevado no causa sntomas. DIAGNSTICO  El mdico puede realizar un anlisis de sangre para controlar si tiene el colesterol elevado. Si es mayor de 20aos, el mdico puede controlarle el colesterol cada 4 a 6aos. Los  controles pueden ser ms frecuentes si ya tuvo el colesterol elevado u otros factores  de riesgo de enfermedades cardacas. El anlisis de sangre de colesterol mide lo siguiente:  El colesterol malo (colesterol LDL), el tipo que causa enfermedades cardacas. Este valor debe estar por debajo de 100.  El colesterol bueno (colesterol HDL), el tipo que ayuda a brindar proteccin contra las enfermedades cardacas. El nivel saludable de colesterol HDL es 60 o ms.  Colesterol total Es el valor combinado del colesterol LDL y el HDL. El valor saludable es de menos de200. TRATAMIENTO  El colesterol elevado puede tratarse con cambios en la dieta y el estilo de vida, y con medicamentos.   Los Levi Strauss en la dieta pueden incluir la ingesta de una mayor cantidad de cereales integrales, frutas, verduras, frutos secos y pescado. Adems, tal vez deba dejar de comer carne roja y alimentos con gran cantidad de azcares agregados.  Los Levi Strauss en el estilo de vida pueden incluir sesiones de ejercicios aerbicos durante por lo menos 66minutos, tres veces por semana, entre ellos, caminar, andar en bicicleta y nadar. Los ejercicios aerbicos junto con una dieta sana pueden ayudar a que se Quarry manager en un peso saludable. Los Levi Strauss en el estilo de vida tambin pueden incluir dejar de fumar.  Si los [employer] dieta y [place] de vida no son suficientes para Advertising copywriter, el mdico puede recetarle una estatina. Se ha demostrado que Coca-Cola reduce el colesterol y, [place], el riesgo de enfermedades cardacas. INSTRUCCIONES PARA EL CUIDADO EN EL HOGAR  Tome los medicamentos de venta libre o recetados solamente segn las indicaciones del mdico.  Illene Bolus una dieta sana tal como el mdico le indic. Por ejemplo:  Coma pollo (sin piel), pescado, ternera, mariscos, pechuga de [place] y cortes de carne roja de pulpa o de lomo.  No coma comidas fritas y carnes grasas, como salchichas y salame.  Coma  muchas frutas, como manzanas.  Coma gran cantidad de verduras, como brcoli, papas y zanahorias.  Coma porotos, guisantes secos y lentejas.  Coma cereales, como cebada, arroz, cuscus y trigo burgol.  Coma pastas sin salsas con crema.  Tome leche semidescremada y sin grasa, y coma yogur y quesos bajos en grasas o sin grasa. No coma ni beba Mattel, crema, helado, yemas de huevo y quesos duros.  No coma margarinas en barra ni untables que contengan grasas trans (que tambin se conocen como aceites parcialmente hidrogenados).  No coma tortas, galletas, galletitas ni otros productos horneados que contengan grasas trans.  No coma aceites tropicales saturados, como el de coco y el de [place].  Haga ejercicio segn las indicaciones del mdico. Aumente el [place] de [place], por [place], haga jardinera o salga a caminar.  Cumpla con todas las visitas de control. SOLICITE ATENCIN MDICA SI:  Tiene dificultad para seguir [place] o mantener un peso saludable.  Necesita ayuda para comenzar un programa de ejercicios.  Necesita ayuda para dejar de fumar. SOLICITE ATENCIN MDICA DE INMEDIATO SI:  Siente dolor en el pecho.  Tiene dificultad para respirar. Document Released: 02/01/2005 Document Revised: 06/18/2013 [facility] Patient Information 2015 [place], [place]. This information is not intended to replace advice given to you by your health care provider. Make sure you discuss any questions you have with your health care provider. Hipotiroidismo (Hypothyroidism) La tiroides es una glndula grande ubicada en la parte anterior e inferior del cuello. La glndula tiroides interviene en el control del metabolismo. El metabolismo es el modo en que el organismo utiliza los alimentos. El control del metabolismo se Musician  a travs de una hormona denominada tiroxina. Cuando la actividad de esta glndula est por debajo de lo normal (hipotiroidismo) produce muy poca cantidad de  hormona. CAUSAS Aqu se incluyen:   Ausencia de tejido tiroideo.  Bocio por dficit de yodo.  Bocio por medicamentos.  Defectos congnitos (desde el nacimiento).  Trastornos de la glndula pituitaria Esto ocasiona la falta de TSH (sigla que significa hormona estimulante de la tiroides) Esta hormona le informa a la tiroides que debe producir ms hormona. SNTOMAS  Letargia (sentir que no se tiene Teacher, early years/pre)  Intolerancia al fro  [employer] (a pesar de una ingesta normal de alimentos)  Piel seca  [place] seco  Irregularidades menstruales  Enlentecimiento de los procesos de pensamiento La insuficiente cantidad de hormona tiroidea tambin puede ocasionar problemas cardacos. El hipotiroidismo en el recin nacido es el cretinismo en su forma extrema. Es importante que esta forma se trate de modo adecuado e inmediato, ya que puede conducir rpidamente al retardo del desarrollo fsico y mental. DIAGNSTICO Para comprobar la existencia de hipotiroidismo, Mining engineer anlisis de sangre y radiografas y estudios con [place]. Muchas veces los signos estn ocultos. Es necesario que el profesional vigile la enfermedad con anlisis de [employer]. Esto se realiza luego de Electrical engineer diagnstico (determinar cul es el problema). Puede ser necesario que el profesional que lo asiste controle esta enfermedad con anlisis de sangre ya sea antes o despus del diagnstico y [place]. TRATAMIENTO Los niveles bajos de hormona tiroidea se incrementan con el uso de hormona tiroidea sinttica. Este es un tratamiento seguro y [place]. Se dispone de hormona tiroidea sinttica para el tratamiento de este trastorno. Generalmente lleva algunas semanas obtener el efecto total de los medicamentos. Luego de obtener el efecto completo del [place], habitualmente pasan otras cuatro semanas para que los sntomas empiezan a Armed forces operational officer. El profesional podr comenzar indicndole dosis  bajas. Si usted tuvo problemas cardacos, la dosis se aumentar de manera gradual. Podr volver a lo normal sin Firefighter una situacin de emergencia. [place] los [employer] ha indicado el profesional que lo asiste. Infrmele al profesional todos los medicamentos que toma o que ha comenzado a Radio producer. El profesional que lo asiste lo ayudar con los esquemas de las dosis.  A medida que obtiene mejora, es necesario aumentar la dosis. Ser necesario Optometrist continuos anlisis de [place], segn lo indique el profesional.  Informe acerca de todos los efectos secundarios que sospeche que podran deberse a los medicamentos. SOLICITE ATENCIN MDICA SI: Solicite atencin mdica si observa:  Sudoracin.  Temblores.  Ansiedad.  Rpida prdida de peso.  Intolerancia al calor.  Cambios emocionales.  Diarrea.  Debilidad. SOLICITE ATENCIN MDICA DE INMEDIATO SI: Presenta dolor en el pecho, una frecuencia cardaca irregular (palpitaciones) o latidos cardacos rpidos. EST SEGURO QUE:   Comprende las instrucciones para el alta mdica.  Controlar su enfermedad.  Solicitar atencin mdica de inmediato segn las indicaciones. Document Released: 02/01/2005 Document Revised: 04/26/2011 [facility] Patient Information 2015 [place]. This information is not intended to replace advice given to you by your health care provider. Make sure you discuss any questions you have with your health care provider.

## 2013-09-11 NOTE — Progress Notes (Signed)
   Patient is a 48 year old who presented to the office today to discuss her recent labs. Review of her records indicated she has not been seen in the office since November of 2014. Her lab work at that time demonstrated the following:  Results for Dawn Villegas, Dawn Villegas (MRN [id number]) as of 09/11/2013 16:33  Ref. Range 01/09/2013 09:05  Cholesterol Latest Range: 0-200 mg/dL 219 (H)  Triglycerides Latest Range: <150 mg/dL 103  HDL Latest Range: >39 mg/dL 53  LDL (calc) Latest Range: 0-99 mg/dL 145 (H)  VLDL Latest Range: 0-40 mg/dL 21  Total CHOL/HDL Ratio No range found 4.1    Her TSH was found to be elevated with a value 7.247 and a nonresponse to messages. She was placed on a low cholesterol diet and was instructed to return back to the office to repeat her fasting lipid profile as well as her TSH and she did not do so until June June 19 whereby her lipid profile demonstrated the following:  Results for Dawn Villegas, Dawn Villegas (MRN [id number]) as of 09/11/2013 16:33  Ref. Range 08/03/2013 08:32  Cholesterol Latest Range: 0-200 mg/dL 235 (H)  Triglycerides Latest Range: <150 mg/dL 78  HDL Latest Range: >39 mg/dL 75  LDL (calc) Latest Range: 0-99 mg/dL 144 (H)  VLDL Latest Range: 0-40 mg/dL 16  Total CHOL/HDL Ratio No range found 3.1    She has been complaining of target is fatigue weight gain temperature instability at times. She does have strong family history of cardiovascular disease her grandfather and her father. Her father has had open heart surgery has been on dialysis secondary to diabetes as well.  We had a long lengthy discussion about the importance of proper nutrition as well as regular exercise. Despite her working on her diet her LDL continues to be elevated at 144 this along with a strong family history cardiovascular disease merits starting the patient on statin. She is going to be started on Lipitor 10 mg daily. We have discussed in Spanish as well as a handout provided indicating  potential side effects to include arthralgias, headaches, myalgia, abdominal pain, flatulence as well as elevated liver function tests and future risk of diabetes. She fully understands and accepts. Work one to check today A. Baseline SGOT and SGPT before she starts the medication. We're also going to repeat her TSH today and if indeed there is indication of hypothyroidism she will be started on Synthroid and we will notify her at that time. I provided her with literature and information on hypothyroidism as well. We discussed the importance of compliance. She is scheduled to return back in 4 months for her annual exam we will do a fasting lipid profile and once again check her liver function tests as well. Because of her fatigue and muscle tiredness we're going to check a vitamin D level as well.

## 2013-09-12 LAB — TSH: TSH: 5.743 u[IU]/mL — AB (ref 0.350–4.500)

## 2013-09-12 LAB — VITAMIN D 25 HYDROXY (VIT D DEFICIENCY, FRACTURES): VIT D 25 HYDROXY: 34 ng/mL (ref 30–89)

## 2013-09-12 LAB — AST: AST: 17 U/L (ref 0–37)

## 2013-09-12 LAB — ALT: ALT: 11 U/L (ref 0–35)

## 2013-09-13 ENCOUNTER — Ambulatory Visit: Payer: 59 | Admitting: Gynecology

## 2013-09-19 ENCOUNTER — Other Ambulatory Visit: Payer: Self-pay | Admitting: Anesthesiology

## 2013-09-19 MED ORDER — LEVOTHYROXINE SODIUM 25 MCG PO TABS: 25.0000 ug | ORAL_TABLET | Freq: Every day | ORAL | Status: DC

## 2013-10-26 ENCOUNTER — Other Ambulatory Visit: Payer: Self-pay | Admitting: Anesthesiology

## 2013-10-26 ENCOUNTER — Other Ambulatory Visit: Payer: 59

## 2013-10-26 DIAGNOSIS — R3129 Other microscopic hematuria: Secondary | ICD-10-CM

## 2013-10-26 DIAGNOSIS — E7800 Pure hypercholesterolemia, unspecified: Secondary | ICD-10-CM

## 2013-10-26 DIAGNOSIS — R7989 Other specified abnormal findings of blood chemistry: Secondary | ICD-10-CM

## 2013-10-26 LAB — URINALYSIS W MICROSCOPIC + REFLEX CULTURE
BILIRUBIN URINE: NEGATIVE
Bacteria, UA: NONE SEEN
Casts: NONE SEEN
Crystals: NONE SEEN
Glucose, UA: NEGATIVE mg/dL
HGB URINE DIPSTICK: NEGATIVE
KETONES UR: NEGATIVE mg/dL
Nitrite: NEGATIVE
PROTEIN: NEGATIVE mg/dL
RBC / HPF: NONE SEEN RBC/hpf (ref <3)
Specific Gravity, Urine: 1.024 (ref 1.005–1.030)
Squamous Epithelial / LPF: NONE SEEN
UROBILINOGEN UA: 0.2 mg/dL (ref 0.0–1.0)
pH: 5.5 (ref 5.0–8.0)

## 2013-10-27 LAB — URINE CULTURE

## 2013-12-17 ENCOUNTER — Encounter: Payer: Self-pay | Admitting: Gynecology

## 2014-01-04 ENCOUNTER — Ambulatory Visit (INDEPENDENT_AMBULATORY_CARE_PROVIDER_SITE_OTHER): Payer: 59 | Admitting: Gynecology

## 2014-01-04 ENCOUNTER — Encounter: Payer: Self-pay | Admitting: Gynecology

## 2014-01-04 VITALS — BP 122/82 | Ht 61.0 in | Wt 143.0 lb

## 2014-01-04 DIAGNOSIS — Z83719 Family history of colon polyps, unspecified: Secondary | ICD-10-CM

## 2014-01-04 DIAGNOSIS — Z8632 Personal history of gestational diabetes: Secondary | ICD-10-CM

## 2014-01-04 DIAGNOSIS — R140 Abdominal distension (gaseous): Secondary | ICD-10-CM

## 2014-01-04 DIAGNOSIS — R635 Abnormal weight gain: Secondary | ICD-10-CM

## 2014-01-04 DIAGNOSIS — E038 Other specified hypothyroidism: Secondary | ICD-10-CM

## 2014-01-04 DIAGNOSIS — Z01419 Encounter for gynecological examination (general) (routine) without abnormal findings: Secondary | ICD-10-CM

## 2014-01-04 DIAGNOSIS — M6289 Other specified disorders of muscle: Secondary | ICD-10-CM

## 2014-01-04 DIAGNOSIS — Z8371 Family history of colonic polyps: Secondary | ICD-10-CM

## 2014-01-04 LAB — COMPREHENSIVE METABOLIC PANEL
ALT: 17 U/L (ref 0–35)
AST: 18 U/L (ref 0–37)
Albumin: 4.3 g/dL (ref 3.5–5.2)
Alkaline Phosphatase: 61 U/L (ref 39–117)
BILIRUBIN TOTAL: 0.5 mg/dL (ref 0.2–1.2)
BUN: 16 mg/dL (ref 6–23)
CO2: 28 mEq/L (ref 19–32)
Calcium: 9.3 mg/dL (ref 8.4–10.5)
Chloride: 102 mEq/L (ref 96–112)
Creat: 0.75 mg/dL (ref 0.50–1.10)
GLUCOSE: 83 mg/dL (ref 70–99)
Potassium: 4.2 mEq/L (ref 3.5–5.3)
Sodium: 138 mEq/L (ref 135–145)
Total Protein: 7.3 g/dL (ref 6.0–8.3)

## 2014-01-04 LAB — CBC WITH DIFFERENTIAL/PLATELET
Basophils Absolute: 0.1 10*3/uL (ref 0.0–0.1)
Basophils Relative: 1 % (ref 0–1)
EOS PCT: 3 % (ref 0–5)
Eosinophils Absolute: 0.2 10*3/uL (ref 0.0–0.7)
HCT: 41.3 % (ref 36.0–46.0)
Hemoglobin: 13.8 g/dL (ref 12.0–15.0)
Lymphocytes Relative: 26 % (ref 12–46)
Lymphs Abs: 1.8 10*3/uL (ref 0.7–4.0)
MCH: 29.1 pg (ref 26.0–34.0)
MCHC: 33.4 g/dL (ref 30.0–36.0)
MCV: 86.9 fL (ref 78.0–100.0)
MPV: 11 fL (ref 9.4–12.4)
Monocytes Absolute: 0.5 10*3/uL (ref 0.1–1.0)
Monocytes Relative: 7 % (ref 3–12)
Neutro Abs: 4.3 10*3/uL (ref 1.7–7.7)
Neutrophils Relative %: 63 % (ref 43–77)
PLATELETS: 330 10*3/uL (ref 150–400)
RBC: 4.75 MIL/uL (ref 3.87–5.11)
RDW: 14.5 % (ref 11.5–15.5)
WBC: 6.9 10*3/uL (ref 4.0–10.5)

## 2014-01-04 LAB — LIPID PANEL
Cholesterol: 165 mg/dL (ref 0–200)
HDL: 60 mg/dL (ref >39)
LDL CALC: 95 mg/dL (ref 0–99)
TRIGLYCERIDES: 52 mg/dL (ref <150)
Total CHOL/HDL Ratio: 2.8 Ratio
VLDL: 10 mg/dL (ref 0–40)

## 2014-01-04 LAB — TSH: TSH: 3.565 u[IU]/mL (ref 0.350–4.500)

## 2014-01-04 MED ORDER — LEVOTHYROXINE SODIUM 25 MCG PO TABS: 25.0000 ug | ORAL_TABLET | Freq: Every day | ORAL | Status: DC

## 2014-01-04 NOTE — Progress Notes (Signed)
Dawn Villegas [birth year] [id number]   History:    48 y.o.  for annual gyn exam with complaint of being tired and fatigued. Patient with history of hypothyroidism currently on levothyroxine 25 g daily. Patient stated also that with 2 of her pregnancies she had gestational diabetes. Her father had insulin-dependent diabetes resulting in kidney disease having to be place on dialysis and subsequently died. Patient was weighing 134 pounds last year is up to 143 pounds. Her flu vaccine was given at work. She does have cravings for sweets. She also complained of feeling bloated especially after eating. She does state that her sister had colon polyps and his younger than she is. She reports normal menstrual cycles lasting 4 days. Currently not using any form of contraception. Patient with no past history of abnormal Pap smears.  Past medical history,surgical history, family history and social history were all reviewed and documented in the EPIC chart.  Gynecologic History Patient's last menstrual period was 12/28/2013. Contraception: none Last Pap: 2014. Results were: normal Last mammogram: 2013. Results were: normal  Obstetric History OB History  Gravida Para Term Preterm AB SAB TAB Ectopic Multiple Living  5 4   1 1    4     # Outcome Date GA Lbr Len/2nd Weight Sex Delivery Anes PTL Lv  5 SAB           4 Para           3 Para           2 Para           1 Para                ROS: A ROS was performed and pertinent positives and negatives are included in the history.  GENERAL: No fevers or chills. HEENT: No change in vision, no earache, sore throat or sinus congestion. NECK: No pain or stiffness. CARDIOVASCULAR: No chest pain or pressure. No palpitations. PULMONARY: No shortness of breath, cough or wheeze. GASTROINTESTINAL: No abdominal pain, nausea, vomiting or diarrhea, melena or bright red blood per rectum. GENITOURINARY: No urinary frequency, urgency, hesitancy or dysuria.  MUSCULOSKELETAL: No joint or muscle pain, no back pain, no recent trauma. DERMATOLOGIC: No rash, no itching, no lesions. ENDOCRINE: No polyuria, polydipsia, no heat or cold intolerance. No recent change in weight. HEMATOLOGICAL: No anemia or easy bruising or bleeding. NEUROLOGIC: No headache, seizures, numbness, tingling or weakness. PSYCHIATRIC: No depression, no loss of interest in normal activity or change in sleep pattern.     Exam: chaperone present  BP 122/82 mmHg  Ht 5\' 1"  (1.549 m)  Wt 143 lb (64.864 kg)  BMI 27.03 kg/m2  LMP 12/28/2013  Body mass index is 27.03 kg/(m^2).  General appearance : Well developed well nourished female. No acute distress HEENT: Neck supple, trachea midline, no carotid bruits, no thyroidmegaly Lungs: Clear to auscultation, no rhonchi or wheezes, or rib retractions  Heart: Regular rate and rhythm, no murmurs or gallops Breast:Examined in sitting and supine position were symmetrical in appearance, no palpable masses or tenderness,  no skin retraction, no nipple inversion, no nipple discharge, no skin discoloration, no axillary or supraclavicular lymphadenopathy Abdomen: no palpable masses or tenderness, no rebound or guarding Extremities: no edema or skin discoloration or tenderness  Pelvic:  Bartholin, Urethra, Skene Glands: Within normal limits             Vagina: No gross lesions or discharge  Cervix: No gross lesions or discharge  Uterus  anteverted, normal size, shape and consistency, non-tender and mobile  Adnexa  Without masses or tenderness  Anus and perineum  normal   Rectovaginal  normal sphincter tone without palpated masses or tenderness             Hemoccult not indicated     Assessment/Plan:  48 y.o. female for annual exam patient will return back to the office in 1-2 weeks for pelvic ultrasound to better assess her adnexa. If ultrasound is normal with her sensation of feeling bloated and family history of colon polyps on going to refer  to the gastroenterologist. Because of her tiredness and fatigue and family history of diabetes were going to check a competence metabolic panel to check her blood sugar. We'll also be checking a fasting lipid profile, TSH, CBC, vitamin D level and urinalysis. Pap smear not done today in accordance to the new guidelines. Patient was reminded to schedule her mammogram which is overdue. We discussed importance of monthly breast exam. We discussed importance of calcium and vitamin D and regular exercise for osteoporosis prevention.   Terrance Mass MD, 9:01 AM 01/04/2014

## 2014-01-04 NOTE — Patient Instructions (Signed)
Transvaginal Ultrasound Transvaginal ultrasound is a pelvic ultrasound, using a metal probe that is placed in the vagina, to look at a women's female organs. Transvaginal ultrasound is a method of seeing inside the pelvis of a woman. The ultrasound machine sends out sound waves from the transducer (probe). These sound waves bounce off body structures (like an echo) to create a picture. The picture shows up on a monitor. It is called transvaginal because the probe is inserted into the vagina. There should be very little discomfort from the vaginal probe. This test can also be used during pregnancy. Endovaginal ultrasound is another name for a transvaginal ultrasound. In a transabdominal ultrasound, the probe is placed on the outside of the belly. This method gives pictures that are lower quality than pictures from the transvaginal technique. Transvaginal ultrasound is used to look for problems of the female genital tract. Some such problems include:  Infertility problems.  Congenital (birth defect) malformations of the uterus and ovaries.  Tumors in the uterus.  Abnormal bleeding.  Ovarian tumors and cysts.  Abscess (inflamed tissue around pus) in the pelvis.  Unexplained abdominal or pelvic pain.  Pelvic infection. DURING PREGNANCY, TRANSVAGINAL ULTRASOUND MAY BE USED TO LOOK AT:  Normal pregnancy.  Ectopic pregnancy (pregnancy outside the uterus).  Fetal heartbeat.  Abnormalities in the pelvis, that are not seen well with transabdominal ultrasound.  Suspected twins or multiples.  Impending miscarriage.  Problems with the cervix (incompetent cervix, not able to stay closed and hold the baby).  When doing an amniocentesis (removing fluid from the pregnancy sac, for testing).  Looking for abnormalities of the baby.  Checking the growth, development, and age of the fetus.  Measuring the amount of fluid in the amniotic sac.  When doing an external version of the baby (moving  baby into correct position).  Evaluating the baby for problems in high risk pregnancies (biophysical profile).  Suspected fetal demise (death). Sometimes a special ultrasound method called Saline Infusion Sonography (SIS) is used for a more accurate look at the uterus. Sterile saline (salt water) is injected into the uterus of non-pregnant patients to see the inside of the uterus better. SIS is not used on pregnant women. The vaginal probe can also assist in obtaining biopsies of abnormal areas, in draining fluid from cysts on the ovary, and in finding IUDs (intrauterine device, birth control) that cannot be located. PREPARATION FOR TEST A transvaginal ultrasound is done with the bladder empty. The transabdominal ultrasound is done with your bladder full. You may be asked to drink several glasses of water before that exam. Sometimes, a transabdominal ultrasound is done just after a transvaginal ultrasound, to look at organs in your abdomen. PROCEDURE  You will lie down on a table, with your knees bent and your feet in foot holders. The probe is covered with a condom. A sterile lubricant is put into the vagina and on the probe. The lubricant helps transmit the sound waves and avoid irritating the vagina. Your caregiver will move the probe inside the vaginal cavity to scan the pelvic structures. A normal test will show a normal pelvis and normal contents. An abnormal test will show abnormalities of the pelvis, placenta, or baby. ABNORMAL RESULTS MAY BE DUE TO:  Growths or tumors in the:  Uterus.  Ovaries.  Vagina.  Other pelvic structures.  Non-cancerous growths of the uterus and ovaries.  Twisting of the ovary, cutting off blood supply to the ovary (ovarian torsion).  Areas of infection, including:  Pelvic  inflammatory disease.  Abscess in the pelvis.  Locating an IUD. PROBLEMS FOUND IN PREGNANT WOMEN MAY INCLUDE:  Ectopic pregnancy (pregnancy outside the uterus).  Multiple  pregnancies.  Early dilation (opening) of the cervix. This may indicate an incompetent cervix and early delivery.  Impending miscarriage.  Fetal death.  Problems with the placenta, including:  Placenta has grown over the opening of the womb (placenta previa).  Placenta has separated early in the womb (placental abruption).  Placenta grows into the muscle of the uterus (placenta accreta).  Tumors of pregnancy, including gestational trophoblastic disease. This is an abnormal pregnancy, with no fetus. The uterus is filled with many grape-like cysts that could sometimes be cancerous.  Incorrect position of the fetus (breech, vertex).  Intrauterine fetal growth retardation (IUGR) (poor growth in the womb).  Fetal abnormalities or infection. RISKS AND COMPLICATIONS There are no known risks to the ultrasound procedure. There is no X-ray used when doing an ultrasound. Document Released: 01/14/2004 Document Revised: 04/26/2011 Document Reviewed: 01/01/2009 [facility] Patient Information 2015 [place], [place]. This information is not intended to replace advice given to you by your health care provider. Make sure you discuss any questions you have with your health care provider. Colonoscopa (Colonoscopy) Ardelia Mems colonoscopa es un examen que se realiza para examinar todo el intestino grueso (colon). Este examen puede ayudar a Hydrographic surveyor problemas, como tumores, plipos, inflamacin y reas de Social research officer, government. El examen dura aproximadamente 1hora.  INFORME A SU MDICO:   Cualquier alergia que tenga.  Todos los [employer], incluidos vitaminas, hierbas, gotas oftlmicas, cremas y medicamentos de venta libre.  Problemas previos que usted o los [employer] de su familia hayan tenido con el uso de anestsicos.  Enfermedades de [employer].  Cirugas previas.  Enfermedades patolgicas. RIESGOS Y COMPLICACIONES  En general, se trata de un procedimiento seguro. Sin embargo, Games developer  procedimiento, pueden surgir complicaciones. Las complicaciones posibles son:  Hemorragias.  Desgarro o ruptura de la pared del colon.  Reaccin a los Stage manager.  Infeccin (raro). ANTES DEL PROCEDIMIENTO   Consulte a su mdico si debe cambiar o suspender los medicamentos que toma habitualmente.  Posiblemente se le recete una preparacin del colon por va oral. Esto incluye beber una gran cantidad de lquido medicinal desde el da anterior a su procedimiento. El lquido har que elimine muchas heces blandas hasta que sean casi claras o de color verdoso claro. De esta manera limpiar el colon para prepararlo para el procedimiento.  No coma ni beba nada ms una vez que haya comenzado con la preparacin del colon, a menos que el mdico le indique que es seguro [place].  [place] a alguna persona que la lleve a su casa luego del procedimiento. PROCEDIMIENTO   Le administrarn un medicamento para que pueda relajarse (sedante).  Se recostar de costado con las rodillas flexionadas.  Se insertar un tubo largo y flexible con Ardelia Mems luz y Ardelia Mems cmara en el extremo (colonoscopio) a travs del recto y dentro del colon. La cmara enviar el video hacia una pantalla de computadora a medida que se vaya moviendo por el colon. El colonoscopio tambin libera dixido de carbono para inflar el colon. Esto ayuda a que el mdico pueda ver mejor el rea.  Durante el examen, es posible que su mdico tome una pequea muestra de tejido (biopsia) para examinarla bajo el microscopio, si se encuentran anormalidades.  El examen finaliza cuando se ha examinado todo el colon. DESPUS DEL PROCEDIMIENTO   No conduzca  vehculos durante las 24horas posteriores al examen.  Es posible que encuentre una pequea cantidad de sangre en la materia fecal.  Ileene Hutchinson tenga cantidades moderadas de gases y calambres o hinchazn abdominales leves. Esto se produce a causa del gas utilizado para inflar  el colon durante el examen.  Pregunte cundo estarn [employer] del examen y cmo los obtendr. Asegrese de [place]. Document Released: 11/11/2004 Document Revised: 11/22/2012 [facility] Patient Information 2015 [place]. This information is not intended to replace advice given to you by your health care provider. Make sure you discuss any questions you have with your health care provider.

## 2014-01-05 LAB — URINALYSIS W MICROSCOPIC + REFLEX CULTURE
BILIRUBIN URINE: NEGATIVE
Bacteria, UA: NONE SEEN
Casts: NONE SEEN
Crystals: NONE SEEN
GLUCOSE, UA: NEGATIVE mg/dL
HGB URINE DIPSTICK: NEGATIVE
LEUKOCYTES UA: NEGATIVE
Nitrite: NEGATIVE
PROTEIN: NEGATIVE mg/dL
Specific Gravity, Urine: 1.024 (ref 1.005–1.030)
UROBILINOGEN UA: 0.2 mg/dL (ref 0.0–1.0)
pH: 6 (ref 5.0–8.0)

## 2014-01-05 LAB — VITAMIN D 25 HYDROXY (VIT D DEFICIENCY, FRACTURES): Vit D, 25-Hydroxy: 23 ng/mL — ABNORMAL LOW (ref 30–100)

## 2014-01-07 ENCOUNTER — Other Ambulatory Visit: Payer: Self-pay | Admitting: Gynecology

## 2014-01-07 DIAGNOSIS — E559 Vitamin D deficiency, unspecified: Secondary | ICD-10-CM

## 2014-01-07 MED ORDER — VITAMIN D (ERGOCALCIFEROL) 1.25 MG (50000 UNIT) PO CAPS: 50000.0000 [IU] | ORAL_CAPSULE | ORAL | Status: DC

## 2014-01-14 ENCOUNTER — Other Ambulatory Visit: Payer: Self-pay | Admitting: Gynecology

## 2014-01-23 ENCOUNTER — Other Ambulatory Visit: Payer: Self-pay | Admitting: Gynecology

## 2014-01-23 ENCOUNTER — Encounter (INDEPENDENT_AMBULATORY_CARE_PROVIDER_SITE_OTHER): Payer: 59 | Admitting: Gynecology

## 2014-01-23 ENCOUNTER — Ambulatory Visit (INDEPENDENT_AMBULATORY_CARE_PROVIDER_SITE_OTHER): Payer: 59

## 2014-01-23 ENCOUNTER — Ambulatory Visit: Payer: 59

## 2014-01-23 ENCOUNTER — Ambulatory Visit (INDEPENDENT_AMBULATORY_CARE_PROVIDER_SITE_OTHER): Payer: 59 | Admitting: Gynecology

## 2014-01-23 ENCOUNTER — Encounter: Payer: Self-pay | Admitting: Gynecology

## 2014-01-23 ENCOUNTER — Other Ambulatory Visit: Payer: 59

## 2014-01-23 VITALS — BP 124/82

## 2014-01-23 DIAGNOSIS — N831 Corpus luteum cyst of ovary, unspecified side: Secondary | ICD-10-CM

## 2014-01-23 DIAGNOSIS — D251 Intramural leiomyoma of uterus: Secondary | ICD-10-CM

## 2014-01-23 DIAGNOSIS — N840 Polyp of corpus uteri: Secondary | ICD-10-CM

## 2014-01-23 DIAGNOSIS — N83202 Unspecified ovarian cyst, left side: Secondary | ICD-10-CM

## 2014-01-23 DIAGNOSIS — R140 Abdominal distension (gaseous): Secondary | ICD-10-CM

## 2014-01-23 DIAGNOSIS — D250 Submucous leiomyoma of uterus: Secondary | ICD-10-CM

## 2014-01-23 DIAGNOSIS — N8320 Unspecified ovarian cysts: Secondary | ICD-10-CM

## 2014-01-23 DIAGNOSIS — R9389 Abnormal findings on diagnostic imaging of other specified body structures: Secondary | ICD-10-CM

## 2014-01-23 DIAGNOSIS — R938 Abnormal findings on diagnostic imaging of other specified body structures: Secondary | ICD-10-CM

## 2014-01-23 DIAGNOSIS — Z8371 Family history of colonic polyps: Secondary | ICD-10-CM

## 2014-01-23 NOTE — Progress Notes (Addendum)
   Patient presented to the office today for follow-up ultrasound. Patient was seen in the office for her annual exam on 01/04/2014 patient had been complaining of being tired and fatigued. Patient has a history of hypothyroidism and is on levothyroxine 25 g daily. During that office visit she had a CBC, conference metabolic panel, fasting lipid profile, TSH, and urinalysis which were all normal. Her vitamin D level was found to be low at 23 and she was started on vitamin D 50,000 units every weekly for 12 weeks. She was instructed to return to the office after the 12 weeks for vitamin D level blood test. She was then to start vitamin D3 2000 units daily thereafter. Patient's sister had been diagnosed with colon polyps in her early 73s and patient was advised that she needs to follow-up with her gastroenterologist for early screening. She denies any hematochezia only abdominal bloating which is the reason for the ultrasound today.  Ultrasound today Uterus measured 11.0 x 7.2 x 5.3 cm with endometrium 26.4 mm. (Patient on day 23 over cycle), a prominent endometrial cavity with a defect measuring 34 x 14 x 25 mm with positive color flow was noted. Patient had several intramural myomas the largest one measuring 18 x 16 mm. Right ovary which was normal. The left ovary had a echo-free cyst measuring 26 x 23 x 18 mm with a thin septum, negative color flow. Thick wall cyst internal low level echoes measuring 17 x 17 mm with positive color flow in the periphery. No fluid in the cul-de-sac.  Patient returned  later in the morning for sonohysterogram. The cervix was cleansed with Betadine solution. A sterile catheter was introduced into the uterine cavity normal saline was instilled and the following was noted: Thickened anterior and posterior endometrium with a right posterior uterine defect measuring 19 x 17 my 15 mm near the fundal region of the uterus consistent with what appears to be an endometrial  polyp.  Following the sonohysterogram a sterile Pipelle was introduced into the cavity and tissue was obtained for histological evaluation.  Assessment/plan: 1 endometrial polyp and intramural myomas. Patient with no bleeding irregularity. Also patient with a small corpus luteum/hemorrhagic cyst of the left ovary. Patient will be scheduled for January for resectoscopic polypectomy. Patient was counseled as to the risks benefits and pros and cons of the operation and literature information was provided. Since she recently had a complete annual exam she will not need a preoperative examination. Patient will follow up with gastroenterologist for early colonoscopy

## 2014-01-23 NOTE — Patient Instructions (Signed)
Marcador tumoral CA-125 (CA-125 Tumor Marker) El CA-125 es un marcador tumoral que sirve para Aeronautical engineer evolucin del cncer de ovario o de endometrio. PREPARACIN PARA EL ESTUDIO No se requiere Scientist, research (life sciences). HALLAZGOS NORMALES Adultos: 0a35unidades/ml (0a60miliunidades/l) Los rangos para los resultados normales pueden variar entre diferentes laboratorios y hospitales. Consulte siempre con su mdico despus de Psychologist, counselling estudio para Armed forces logistics/support/administrative officer significado de los [place] y si los valores se consideran "dentro de los lmites normales". SIGNIFICADO DEL ESTUDIO  El mdico leer los resultados y Electrical engineer con usted sobre la importancia y el significado de los [place], as como las opciones de tratamiento y la necesidad de Optometrist pruebas adicionales, si fuera necesario. OBTENCIN DE LOS RESULTADOS DE LAS PRUEBAS Es su responsabilidad retirar el resultado del [place]. Consulte en el laboratorio cundo y cmo podr [employer]. Document Released: 11/22/2012 [facility] Patient Information 2015 [place]. This information is not intended to replace advice given to you by your health care provider. Make sure you discuss any questions you have with your health care provider. Quiste ovrico (Ovarian Cyst) Un quiste ovrico es una bolsa llena de lquido que se forma en el ovario. Los ovarios son los rganos pequeos que producen vulos en las mujeres. Se pueden formar varios tipos de [employer]. Benito Mccreedy no son cancerosos. Muchos de ellos no causan problemas y con frecuencia desaparecen solos. Algunos pueden provocar sntomas y requerir Clinical research associate. Los tipos ms comunes de quistes ovricos son los siguientes:  Quistes funcionales: estos quistes pueden aparecer todos los meses durante el ciclo menstrual. Esto es normal. Estos quistes suelen desaparecer con el prximo ciclo menstrual si la mujer no queda embarazada. En general, los quistes funcionales no  tienen sntomas.  Endometriomas: estos quistes se forman a partir del tejido que recubre el tero. Tambin se denominan "quistes de chocolate" porque se llenan de sangre que se vuelve marrn. Este tipo de quiste puede Engineer, production en la zona inferior del abdomen durante la relacin sexual y con el perodo menstrual.  Cistoadenomas: este tipo se desarrolla a partir de las clulas que se [place] en el exterior del ovario. Estos quistes pueden ser muy grandes y causar dolor en la zona inferior del abdomen y durante la relacin sexual. Cindra Presume tipo de quiste puede girar sobre s mismo, cortar el suministro de Biochemist, clinical y causar un dolor intenso. Tambin se puede romper con facilidad y Stage manager.  Quistes dermoides: este tipo de quiste a veces se encuentra en ambos ovarios. Estos quistes pueden [employer]'s tipos de tejidos del organismo, como piel, dientes, pelo o Database administrator. Generalmente no tienen sntomas, a menos que sean muy grandes.  Quistes tecalutenicos: aparecen cuando se produce demasiada cantidad de cierta hormona (gonadotropina corinica humana) que estimula en exceso al ovario para que produzca vulos. Esto es ms frecuente despus de procedimientos que ayudan a la concepcin de un beb (fertilizacin in vitro). CAUSAS   Los medicamentos para la fertilidad pueden provocar una afeccin mediante la cual se forman mltiples quistes de gran tamao en los ovarios. Esta se denomina sndrome de hiperestimulacin ovrica.  El sndrome del ovario poliqustico es una afeccin que puede causar desequilibrios hormonales, los cuales pueden dar como resultado quistes ovricos no funcionales. SIGNOS Y SNTOMAS  Muchos quistes ovricos no causan sntomas. Si se presentan sntomas, stos pueden ser:  Dolor o molestias en la pelvis.  Dolor en la parte baja del abdomen.  [place].  Aumento del permetro abdominal (hinchazn).  Perodos menstruales  anormales.  Aumento del [employer] perodos [place].  Cese de los perodos menstruales sin estar embarazada. DIAGNSTICO  Estos quistes se descubren comnmente durante un examen de rutina o una exploracin ginecolgica anual. Es posible que se ordenen otros estudios para obtener ms informacin sobre el [place]. Estos estudios pueden ser:  Engineer, materials.  Radiografas de la pelvis.  Tomografa computada.  Resonancia magntica.  Anlisis de [place]. TRATAMIENTO  Muchos de los quistes ovricos desaparecen por s solos, sin tratamiento. Es probable que el mdico quiera controlar el quiste regularmente durante 2 o 74meses para ver si se produce algn cambio. En el caso de las mujeres en la menopausia, es particularmente importante controlar de cerca al quiste ya que el ndice de cncer de ovario en las mujeres menopusicas es ms alto. Cuando se requiere Clinical research associate, este puede incluir cualquiera de los siguientes:  Un procedimiento para drenar el quiste (aspiracin). Esto se puede realizar [employer] uso de [place] grande y [place]. Tambin se puede hacer a travs de un procedimiento laparoscpico, En este procedimiento, se inserta un tubo delgado que emite luz y que tiene una pequea cmara en un extremo (laparoscopio) a travs de una pequea incisin.  Ciruga para extirpar el quiste completo. Esto se puede realizar mediante una ciruga laparoscpica o Ardelia Mems ciruga abierta, la cual implica realizar una incisin ms grande en la parte inferior del abdomen.  Tratamiento hormonal o pldoras anticonceptivas. Estos mtodos a veces se usan para ayudar a Writer. [place] solo medicamentos de venta libre o recetados, segn las indicaciones del mdico.  Consulting civil engineer a las consultas de control con su mdico segn las indicaciones.  Hgase exmenes plvicos regulares y pruebas de Papanicolaou. SOLICITE ATENCIN MDICA SI:   Los perodos  se atrasan, son irregulares, dolorosos o cesan.  El dolor plvico o abdominal no desaparece.  El abdomen se agranda o se hincha.  Siente presin en la vejiga o no puede vaciarla completamente.  Siente dolor durante las [employer].  Tiene una sensacin de hinchazn, presin o Manufacturing systems engineer.  Pierde peso sin razn aparente.  Siente un Pharmacist, hospital.  Est estreida.  Pierde el apetito.  Le aparece acn.  Nota un aumento del vello corporal y facial.  Elenore Rota de peso sin hacer modificaciones en su actividad fsica y en su dieta habitual.  Sospecha que est embarazada. SOLICITE ATENCIN MDICA DE INMEDIATO SI:   Siente cada vez ms dolor abdominal.  Tiene malestar estomacal (nuseas) y vomita.  Tiene fiebre que se presenta de [place] repentina.  Siente dolor abdominal al defecar.  Sus perodos menstruales son ms abundantes que lo habitual. ASEGRESE DE QUE:   Comprende estas instrucciones.  Controlar su afeccin.  Recibir ayuda de inmediato si no mejora o si empeora. Document Released: 11/11/2004 Document Revised: 02/06/2013 [facility] Patient Information 2015 [place]. This information is not intended to replace advice given to you by your health care provider. Make sure you discuss any questions you have with your health care provider. Fibromas (Fibroids) Los fibromas son bultos (tumores) que pueden Administrator del cuerpo de Musician. Estos tumores no son cancerosos. Pueden variar en tamao, peso y lugar en el que crecen. CUIDADOS EN EL HOGAR  No tome aspirina.  Anote el nmero de apsitos o tampones que [place] durante el perodo. Infrmelo a su mdico. Esto puede ayudar a determinar el mejor tratamiento para usted. [place]  INMEDIATO SI:  Siente dolor en la zona inferior del vientre (abdomen) y no se alivia con analgsicos.  Tiene clicos que no se calman con medicamentos  Aumenta el sangrado entre  perodos o durante el mismo.  Sufre mareos o se desvanece (se desmaya).  El dolor en el vientre [place]. ASEGRESE DE QUE:  Comprende estas instrucciones.  Controlar su enfermedad.  Solicitar ayuda de inmediato si no mejora o empeora. Document Released: 05/19/2010 Document Revised: 04/26/2011 [facility] Patient Information 2015 [place]. This information is not intended to replace advice given to you by your health care provider. Make sure you discuss any questions you have with your health care provider.

## 2014-01-24 NOTE — Progress Notes (Signed)
Canceled appointment.

## 2014-01-25 ENCOUNTER — Other Ambulatory Visit: Payer: Self-pay | Admitting: Gynecology

## 2014-02-26 ENCOUNTER — Other Ambulatory Visit: Payer: Self-pay | Admitting: Gynecology

## 2014-02-26 ENCOUNTER — Telehealth: Payer: Self-pay

## 2014-02-26 MED ORDER — MEGESTROL ACETATE 40 MG PO TABS: 40.0000 mg | ORAL_TABLET | Freq: Two times a day (BID) | ORAL | Status: DC

## 2014-02-26 NOTE — Telephone Encounter (Signed)
Dawn Villegas spoke with patient and informed her. Rx sent. 

## 2014-02-26 NOTE — Telephone Encounter (Signed)
No this is the reason we are doing it, we could put her on Megace 40 mg twice a day for the week before surgery

## 2014-02-26 NOTE — Telephone Encounter (Signed)
Patient scheduled 03/22/14 for Myosure Resection of Polyp. She is calling to inquire is it going to be a problem if she is bleeding at time of surgery?

## 2014-02-28 ENCOUNTER — Ambulatory Visit: Admit: 2014-02-28 | Payer: Self-pay | Admitting: Gynecology

## 2014-02-28 SURGERY — DILATATION & CURETTAGE/HYSTEROSCOPY WITH TRUCLEAR
Anesthesia: General

## 2014-03-15 ENCOUNTER — Encounter (HOSPITAL_COMMUNITY)
Admission: RE | Admit: 2014-03-15 | Discharge: 2014-03-15 | Disposition: A | Discharge status: Home / Self Care | Payer: 59 | Source: Ambulatory Visit | Attending: Gynecology | Admitting: Gynecology

## 2014-03-15 ENCOUNTER — Encounter (HOSPITAL_COMMUNITY): Payer: Self-pay

## 2014-03-15 DIAGNOSIS — Z01818 Encounter for other preprocedural examination: Secondary | ICD-10-CM | POA: Insufficient documentation

## 2014-03-15 DIAGNOSIS — N840 Polyp of corpus uteri: Secondary | ICD-10-CM | POA: Insufficient documentation

## 2014-03-15 HISTORY — DX: Insomnia, unspecified: G47.00

## 2014-03-15 HISTORY — DX: Hyperlipidemia, unspecified: E78.5

## 2014-03-15 LAB — CBC
HCT: 39.6 % (ref 36.0–46.0)
Hemoglobin: 13.3 g/dL (ref 12.0–15.0)
MCH: 29.2 pg (ref 26.0–34.0)
MCHC: 33.6 g/dL (ref 30.0–36.0)
MCV: 86.8 fL (ref 78.0–100.0)
Platelets: 307 10*3/uL (ref 150–400)
RBC: 4.56 MIL/uL (ref 3.87–5.11)
RDW: 14.7 % (ref 11.5–15.5)
WBC: 9.5 10*3/uL (ref 4.0–10.5)

## 2014-03-15 LAB — URINE MICROSCOPIC-ADD ON

## 2014-03-15 LAB — URINALYSIS, ROUTINE W REFLEX MICROSCOPIC
BILIRUBIN URINE: NEGATIVE
Glucose, UA: NEGATIVE mg/dL
Ketones, ur: NEGATIVE mg/dL
Nitrite: NEGATIVE
PROTEIN: NEGATIVE mg/dL
SPECIFIC GRAVITY, URINE: 1.02 (ref 1.005–1.030)
Urobilinogen, UA: 0.2 mg/dL (ref 0.0–1.0)
pH: 6 (ref 5.0–8.0)

## 2014-03-15 NOTE — Patient Instructions (Addendum)
   Your procedure is scheduled on:    Enter through the Main Entrance of [facility] at: [place] up the phone at the desk and dial [phone] and inform us of your arrival.  Please call this number if you have any problems the morning of surgery: [phone]  Remember: Do not eat food after midnight: Do not drink clear liquids after: Take these medicines the morning of surgery with a SIP OF WATER: chloresterol and thyroid meds  Do not wear jewelry, make-up, or FINGER nail polish No metal in your hair or on your body. Do not wear lotions, powders, perfumes.  You may wear deodorant.  Do not bring valuables to the hospital. Contacts, dentures or bridgework may not be worn into surgery.  Patients discharged on the day of surgery will not be allowed to drive home.  Home with husband Effie Shy cell [phone]

## 2014-03-15 NOTE — Pre-Procedure Instructions (Signed)
Used Microsoft, Administrator, sports for translation during PAT appt.

## 2014-03-21 MED ORDER — DEXTROSE 5 % IV SOLN
2.0000 g | INTRAVENOUS | Status: AC
  Administered 2014-03-22: 2 g via INTRAVENOUS
  Filled 2014-03-21: qty 2

## 2014-03-21 NOTE — H&P (Signed)
Dawn Villegas is an 49 y.o. female. Is scheduled for resectoscopic polypectomy tomorrow February 5. Her history is as follows:  Patient presented to the office today for follow-up ultrasound. Patient was seen in the office for her annual exam on 01/04/2014 patient had been complaining of being tired and fatigued. Patient has a history of hypothyroidism and is on levothyroxine 25 g daily. During that office visit she had a CBC, conference metabolic panel, fasting lipid profile, TSH, and urinalysis which were all normal. Her vitamin D level was found to be low at 23 and she was started on vitamin D 50,000 units every weekly for 12 weeks. She was instructed to return to the office after the 12 weeks for vitamin D level blood test. She was then to start vitamin D3 2000 units daily thereafter. Patient's sister had been diagnosed with colon polyps in her early 41s and patient was advised that she needs to follow-up with her gastroenterologist for early screening. She denies any hematochezia only abdominal bloating which is the reason for the ultrasound .  Ultrasound/sono hysterogram demonstrated the following on 01/23/2014:  Uterus measured 11.0 x 7.2 x 5.3 cm with endometrium 26.4 mm. (Patient on day 23 over cycle), a prominent endometrial cavity with a defect measuring 34 x 14 x 25 mm with positive color flow was noted. Patient had several intramural myomas the largest one measuring 18 x 16 mm. Right ovary which was normal. The left ovary had a echo-free cyst measuring 26 x 23 x 18 mm with a thin septum, negative color flow. Thick wall cyst internal low level echoes measuring 17 x 17 mm with positive color flow in the periphery. No fluid in the cul-de-sac  A sterile catheter was introduced into the uterine cavity normal saline was instilled and the following was noted: Thickened anterior and posterior endometrium with a right posterior uterine defect measuring 19 x 17 my 15 mm near the fundal region of the  uterus consistent with what appears to be an endometrial polyp.  An endometrial biopsy was done with pathology report as follows: Diagnosis Endometrium, biopsy, uterus - POLYPOID DEGENERATING SECRETORY-TYPE ENDOMETRIUM, SEE COMMENT Microscopic Comment This pattern can be associated with menses, irregular shedding or breakthrough bleeding associated with hormonal therapy. Clinical correlation is suggested.   Pertinent Gynecological History: Menses: Normal Bleeding: Normal cycle Contraception: none DES exposure: denies Blood transfusions: none Sexually transmitted diseases: no past history Previous GYN Procedures: Vaginal deliveries  Last mammogram: normal Date: 2013 Last pap: normal Date: 2014 OB History: G 5, P 4 Ab1  Menstrual History: Menarche age: 15 No LMP recorded.    Past Medical History  Diagnosis Date  . Hypothyroidism   . Hyperlipidemia   . SVD (spontaneous vaginal delivery)     x 4  . Insomnia     Past Surgical History  Procedure Laterality Date  . Dilation and curettage of uterus      x 1 mab    Family History  Problem Relation Age of Onset  . Hypertension Mother   . Hypertension Father   . Diabetes Father   . Heart disease Father     Social History:  reports that she has never smoked. She has never used smokeless tobacco. She reports that she does not drink alcohol or use illicit drugs.  Allergies: No Known Allergies  No prescriptions prior to admission    ROS  Physical Exam  HEENT: Unremarkable Neck: Supple check a midline no carotid bruits Lungs: Clear to auscultation Stann [place] or  wheezes Heart: Regular rate and rhythm no murmurs or gallops Breast exam: Both breasts were examined sitting supine position both breasts are symmetrical in appearance no skin discoloration or nipple inversion no palpable mass or tenderness no supraclavicular axillary lymphadenopathy. Abdomen: Soft nontender no rebound or guarding Pelvic: Bartholin urethra Skene  was within normal limits Vagina: No lesions or discharge Cervix: No lesions or discharge Uterus: Anteverted normal size shape and consistency nontender and mobile Adnexa: No palpable masses or tenderness Anus and perineum unremarkable Rectovaginal exam normal sphincter tone without palpated masses or tenderness    Assessment/Plan: Patient with uterine polyp, small intramural fibroids and small functional cyst on left ovary scheduled to undergo resectoscopic polypectomy on Friday, 03/22/2014 at [facility] hospital. The following risk were discussed:                        Patient was counseled as to the risk of surgery to include the following:  1. Infection (prohylactic antibiotics will be administered)  2. DVT/Pulmonary Embolism (prophylactic pneumo compression stockings will be used)  3.Trauma to internal organs requiring additional surgical procedure to repair any injury to     Internal organs requiring perhaps additional hospitalization days.  4.Hemmorhage requiring transfusion and blood products which carry risks such as             anaphylactic reaction, hepatitis and AIDS  Patient had received literature information on the procedure scheduled and all her questions were answered and fully accepts all risk.   [facility] HMD8:02 AMTD     Uvaldo Rising H 03/21/2014, 7:51 AM

## 2014-03-22 ENCOUNTER — Ambulatory Visit (HOSPITAL_COMMUNITY): Payer: 59 | Admitting: Anesthesiology

## 2014-03-22 ENCOUNTER — Encounter (HOSPITAL_COMMUNITY): Payer: Self-pay | Admitting: Anesthesiology

## 2014-03-22 ENCOUNTER — Ambulatory Visit (HOSPITAL_COMMUNITY)
Admission: RE | Admit: 2014-03-22 | Discharge: 2014-03-22 | Disposition: A | Discharge status: Home / Self Care | Payer: 59 | Source: Ambulatory Visit | Attending: Gynecology | Admitting: Gynecology

## 2014-03-22 ENCOUNTER — Encounter (HOSPITAL_COMMUNITY)
Admission: RE | Disposition: A | Discharge status: Home / Self Care | Payer: Self-pay | Source: Ambulatory Visit | Attending: Gynecology

## 2014-03-22 DIAGNOSIS — N840 Polyp of corpus uteri: Secondary | ICD-10-CM | POA: Insufficient documentation

## 2014-03-22 DIAGNOSIS — Z86718 Personal history of other venous thrombosis and embolism: Secondary | ICD-10-CM | POA: Insufficient documentation

## 2014-03-22 DIAGNOSIS — G4700 Insomnia, unspecified: Secondary | ICD-10-CM | POA: Insufficient documentation

## 2014-03-22 DIAGNOSIS — E785 Hyperlipidemia, unspecified: Secondary | ICD-10-CM | POA: Diagnosis not present

## 2014-03-22 DIAGNOSIS — E039 Hypothyroidism, unspecified: Secondary | ICD-10-CM | POA: Insufficient documentation

## 2014-03-22 HISTORY — PX: DILATATION & CURETTAGE/HYSTEROSCOPY WITH MYOSURE: SHX6511

## 2014-03-22 LAB — PREGNANCY, URINE: Preg Test, Ur: NEGATIVE

## 2014-03-22 SURGERY — DILATATION & CURETTAGE/HYSTEROSCOPY WITH MYOSURE
Anesthesia: Monitor Anesthesia Care | Site: Vagina

## 2014-03-22 MED ORDER — KETOROLAC TROMETHAMINE 30 MG/ML IJ SOLN: 30.0000 mg | Freq: Once | INTRAMUSCULAR | Status: DC | PRN

## 2014-03-22 MED ORDER — MIDAZOLAM HCL 2 MG/2ML IJ SOLN: 0.5000 mg | Freq: Once | INTRAMUSCULAR | Status: DC | PRN

## 2014-03-22 MED ORDER — MIDAZOLAM HCL 2 MG/2ML IJ SOLN
INTRAMUSCULAR | Status: DC | PRN
  Administered 2014-03-22 (×2): 1 mg via INTRAVENOUS

## 2014-03-22 MED ORDER — LIDOCAINE HCL (CARDIAC) 20 MG/ML IV SOLN
INTRAVENOUS | Status: AC
  Filled 2014-03-22: qty 5

## 2014-03-22 MED ORDER — PROPOFOL 10 MG/ML IV BOLUS
INTRAVENOUS | Status: AC
  Filled 2014-03-22: qty 20

## 2014-03-22 MED ORDER — KETOROLAC TROMETHAMINE 30 MG/ML IJ SOLN
INTRAMUSCULAR | Status: AC
  Filled 2014-03-22: qty 1

## 2014-03-22 MED ORDER — ONDANSETRON HCL 4 MG/2ML IJ SOLN
INTRAMUSCULAR | Status: AC
  Filled 2014-03-22: qty 2

## 2014-03-22 MED ORDER — ACETAMINOPHEN 325 MG PO TABS: 325.0000 mg | ORAL_TABLET | ORAL | Status: DC | PRN

## 2014-03-22 MED ORDER — VASOPRESSIN 20 UNIT/ML IV SOLN
INTRAVENOUS | Status: AC
  Filled 2014-03-22: qty 1

## 2014-03-22 MED ORDER — DEXAMETHASONE SODIUM PHOSPHATE 10 MG/ML IJ SOLN
INTRAMUSCULAR | Status: DC | PRN
  Administered 2014-03-22: 4 mg via INTRAVENOUS

## 2014-03-22 MED ORDER — PROPOFOL 10 MG/ML IV EMUL
INTRAVENOUS | Status: DC | PRN
  Administered 2014-03-22: 30 mg via INTRAVENOUS
  Administered 2014-03-22 (×2): 10 mg via INTRAVENOUS
  Administered 2014-03-22: 30 mg via INTRAVENOUS
  Administered 2014-03-22: 20 mg via INTRAVENOUS

## 2014-03-22 MED ORDER — FENTANYL CITRATE 0.05 MG/ML IJ SOLN
INTRAMUSCULAR | Status: AC
  Filled 2014-03-22: qty 2

## 2014-03-22 MED ORDER — LIDOCAINE HCL 1 % IJ SOLN
INTRAMUSCULAR | Status: AC
  Filled 2014-03-22: qty 20

## 2014-03-22 MED ORDER — ACETAMINOPHEN 160 MG/5ML PO SOLN: 325.0000 mg | ORAL | Status: DC | PRN

## 2014-03-22 MED ORDER — DEXAMETHASONE SODIUM PHOSPHATE 4 MG/ML IJ SOLN
INTRAMUSCULAR | Status: AC
  Filled 2014-03-22: qty 1

## 2014-03-22 MED ORDER — LACTATED RINGERS IV SOLN
INTRAVENOUS | Status: DC
  Administered 2014-03-22 (×2): via INTRAVENOUS

## 2014-03-22 MED ORDER — LIDOCAINE HCL (PF) 1 % IJ SOLN
INTRAMUSCULAR | Status: DC | PRN
  Administered 2014-03-22: 10 mL

## 2014-03-22 MED ORDER — SODIUM CHLORIDE 0.9 % IR SOLN
Status: DC | PRN
  Administered 2014-03-22: 3000 mL

## 2014-03-22 MED ORDER — KETOROLAC TROMETHAMINE 30 MG/ML IJ SOLN
INTRAMUSCULAR | Status: DC | PRN
  Administered 2014-03-22: 30 mg via INTRAVENOUS

## 2014-03-22 MED ORDER — SCOPOLAMINE 1 MG/3DAYS TD PT72: 1.0000 | MEDICATED_PATCH | Freq: Once | TRANSDERMAL | Status: DC

## 2014-03-22 MED ORDER — FENTANYL CITRATE 0.05 MG/ML IJ SOLN
25.0000 ug | INTRAMUSCULAR | Status: DC | PRN
  Administered 2014-03-22: 25 ug via INTRAVENOUS

## 2014-03-22 MED ORDER — MEPERIDINE HCL 25 MG/ML IJ SOLN: 6.2500 mg | INTRAMUSCULAR | Status: DC | PRN

## 2014-03-22 MED ORDER — PROMETHAZINE HCL 25 MG/ML IJ SOLN
6.2500 mg | INTRAMUSCULAR | Status: DC | PRN
Start: 2014-03-22 — End: 2014-03-22

## 2014-03-22 MED ORDER — MIDAZOLAM HCL 2 MG/2ML IJ SOLN
INTRAMUSCULAR | Status: AC
  Filled 2014-03-22: qty 2

## 2014-03-22 MED ORDER — ONDANSETRON HCL 4 MG/2ML IJ SOLN
INTRAMUSCULAR | Status: DC | PRN
  Administered 2014-03-22: 4 mg via INTRAVENOUS

## 2014-03-22 MED ORDER — FENTANYL CITRATE 0.05 MG/ML IJ SOLN
INTRAMUSCULAR | Status: DC | PRN
  Administered 2014-03-22 (×2): 50 ug via INTRAVENOUS

## 2014-03-22 MED ORDER — SODIUM CHLORIDE 0.9 % IJ SOLN
INTRAMUSCULAR | Status: AC
  Filled 2014-03-22: qty 50

## 2014-03-22 SURGICAL SUPPLY — 28 items
CANISTERS HI-FLOW 3000CC (CANNISTER) ×2 IMPLANT
CATH FOLEY 2WAY SLVR 30CC 16FR (CATHETERS) IMPLANT
CATH ROBINSON RED A/P 16FR (CATHETERS) ×2 IMPLANT
CLOTH BEACON ORANGE TIMEOUT ST (SAFETY) ×2 IMPLANT
CONTAINER PREFILL 10% NBF 60ML (FORM) ×4 IMPLANT
CORD ACTIVE DISPOSABLE (ELECTRODE)
CORD ELECTRO ACTIVE DISP (ELECTRODE) IMPLANT
DEVICE MYOSURE CLASSIC (MISCELLANEOUS) IMPLANT
DEVICE MYOSURE LITE (MISCELLANEOUS) ×1 IMPLANT
ELECT VAPORTRODE GRVD BAR (ELECTRODE) IMPLANT
FILTER ARTHROSCOPY CONVERTOR (FILTER) ×2 IMPLANT
GLOVE BIOGEL PI IND STRL 8 (GLOVE) ×1 IMPLANT
GLOVE BIOGEL PI INDICATOR 8 (GLOVE) ×1
GLOVE ECLIPSE 7.5 STRL STRAW (GLOVE) ×4 IMPLANT
GOWN STRL REUS W/TWL LRG LVL3 (GOWN DISPOSABLE) ×4 IMPLANT
PACK VAGINAL MINOR WOMEN LF (CUSTOM PROCEDURE TRAY) ×2 IMPLANT
PAD OB MATERNITY 4.3X12.25 (PERSONAL CARE ITEMS) ×2 IMPLANT
PAD PREP 24X48 CUFFED NSTRL (MISCELLANEOUS) ×2 IMPLANT
PLUG CATH AND CAP STER (CATHETERS) IMPLANT
SEAL ROD LENS SCOPE MYOSURE (ABLATOR) ×2 IMPLANT
SET BERKELEY SUCTION TUBING (SUCTIONS) IMPLANT
SYR 30ML LL (SYRINGE) IMPLANT
TOWEL OR 17X24 6PK STRL BLUE (TOWEL DISPOSABLE) ×4 IMPLANT
TUBING AQUILEX INFLOW (TUBING) ×2 IMPLANT
TUBING AQUILEX OUTFLOW (TUBING) ×2 IMPLANT
VACURETTE 8 RIGID CVD (CANNULA) IMPLANT
VACURETTE 9 RIGID CVD (CANNULA) IMPLANT
WATER STERILE IRR 1000ML POUR (IV SOLUTION) ×2 IMPLANT

## 2014-03-22 NOTE — Transfer of Care (Signed)
Immediate Anesthesia Transfer of Care Note  Patient: Dawn Villegas  Procedure(s) Performed: Procedure(s): DILATATION & CURETTAGE/HYSTEROSCOPY WITH MYOSURE-RESECTOSCOPIC POLYPECTOMY (N/A)  Patient Location: PACU  Anesthesia Type:MAC  Level of Consciousness: awake, alert , oriented and patient cooperative  Airway & Oxygen Therapy: Patient Spontanous Breathing  Post-op Assessment: Report given to RN and Post -op Vital signs reviewed and stable  Post vital signs: Reviewed and stable  Last Vitals:  Filed Vitals:   03/22/14 1222  BP: 118/76  Pulse: 74  Temp: 36.6 C  Resp: 18    Complications: No apparent anesthesia complications

## 2014-03-22 NOTE — Anesthesia Postprocedure Evaluation (Signed)
  Anesthesia Post Note  Patient: Dawn Villegas  Procedure(s) Performed: Procedure(s) (LRB): DILATATION & CURETTAGE/HYSTEROSCOPY WITH MYOSURE-RESECTOSCOPIC POLYPECTOMY (N/A)  Anesthesia type: MAC  Patient location: PACU  Post pain: Pain level controlled  Post assessment: Post-op Vital signs reviewed  Last Vitals:  Filed Vitals:   03/22/14 1345  BP: 118/63  Pulse: 61  Temp: 36.4 C  Resp: 17    Post vital signs: Reviewed  Level of consciousness: sedated  Complications: No apparent anesthesia complications

## 2014-03-22 NOTE — Interval H&P Note (Signed)
History and Physical Interval Note:  03/22/2014 12:42 PM  Dawn Villegas  has presented today for surgery, with the diagnosis of endometrial polyp  The various methods of treatment have been discussed with the patient and family. After consideration of risks, benefits and other options for treatment, the patient has consented to  Procedure(s): DILATATION & CURETTAGE/HYSTEROSCOPY WITH MYOSURE-RESECTOSCOPIC POLYPECTOMY (N/A) as a surgical intervention .  The patient's history has been reviewed, patient examined, no change in status, stable for surgery.  I have reviewed the patient's chart and labs.  Questions were answered to the patient's satisfaction.     Terrance Mass

## 2014-03-22 NOTE — Anesthesia Preprocedure Evaluation (Addendum)
Anesthesia Evaluation  Patient identified by MRN, date of birth, ID band Patient awake    Reviewed: Allergy & Precautions, H&P , Patient's Chart, lab work & pertinent test results, reviewed documented beta blocker date and time   History of Anesthesia Complications Negative for: history of anesthetic complications  Airway Mallampati: II  TM Distance: >3 FB Neck ROM: full    Dental   Pulmonary  breath sounds clear to auscultation        Cardiovascular Exercise Tolerance: Good Rhythm:regular Rate:Normal     Neuro/Psych negative psych ROS   GI/Hepatic   Endo/Other  Hypothyroidism   Renal/GU      Musculoskeletal   Abdominal   Peds  Hematology   Anesthesia Other Findings   Reproductive/Obstetrics                             Anesthesia Physical Anesthesia Plan  ASA: II  Anesthesia Plan: MAC   Post-op Pain Management:    Induction:   Airway Management Planned:   Additional Equipment:   Intra-op Plan:   Post-operative Plan:   Informed Consent: I have reviewed the patients History and Physical, chart, labs and discussed the procedure including the risks, benefits and alternatives for the proposed anesthesia with the patient or authorized representative who has indicated his/her understanding and acceptance.   Dental Advisory Given  Plan Discussed with: CRNA, Surgeon and Anesthesiologist  Anesthesia Plan Comments:        Anesthesia Quick Evaluation

## 2014-03-22 NOTE — Op Note (Signed)
   Operative Note  03/22/2014  1:44 PM  PATIENT:  Dawn Villegas  49 y.o. female  PRE-OPERATIVE DIAGNOSIS:  endometrial polyp  POST-OPERATIVE DIAGNOSIS:  endometrial polyp  PROCEDURE:  Procedure(s): DILATATION & CURETTAGE/HYSTEROSCOPY WITH MYOSURE-RESECTOSCOPIC POLYPECTOMY  SURGEON:  Surgeon(s): Terrance Mass, MD  ANESTHESIA:   MAC  FINDINGS: Several endometrial polyps and lush endometrium  DESCRIPTION OF OPERATION:FINDINGS: The patient was taken to the operating room where she underwent a successful MAC  anesthesia. Patient had PAS stockings for DVT prophylaxis. She received 2 g of Ancef preop. Time out was undertaken to properly identify the patient and the proper operation schedule. The vagina and perineum were prepped and draped in usual sterile fashion. A red rubber Quentin Cornwall was inserted to empty the bladder is content for approximately 50 cc. Bimanual examination demonstrated an anteverted uterus. Patient's legs were in the high lithotomy position. A weighted speculum was placed in the posterior vaginal vault. A single-tooth tenaculum was placed on the anterior cervical lip. The uterus sounded to 7-1/2 cm. Pratt dilator were used to dilate the cervical canal to a 15 mm size. 1% lidocaine paracervical block for total 10 cc was undertaken. The Hologic Myosure resectoscopic morcellator with a scope of 6.25 mm and an operating blade of 3.0 mm was introduced into the intrauterine cavity. 0.9% normal saline was the distending media. Inspection of the endometrial cavity demonstrated multiple small uterine polyps located in the lower uterine segment. With the resectoscopic morcellator they were removed and submitted for histological evaluation. Fluid deficit was approximately 2 90 cc.  The single-tooth tenaculum was removed patient was extubated transferred to recovery room stable vital signs blood loss was minimal. She received 30 mg of Toradol in route to the recovery  room.    ESTIMATED BLOOD LOSS: Minimal  Intake/Output Summary (Last 24 hours) at 03/22/14 1344 Last data filed at 03/22/14 1319  Gross per 24 hour  Intake   1600 ml  Output     30 ml  Net   1570 ml     BLOOD ADMINISTERED:none   LOCAL MEDICATIONS USED:  XYLOCAINE 1% paracervical block 10 cc  SPECIMEN:  Source of Specimen:  Uterine polyp and endometrial curettings  DISPOSITION OF SPECIMEN:  PATHOLOGY  COUNTS:  YES  PLAN OF CARE: Transfer to PACU  [facility] HMD1:44 PMTD@  Note: This dictation was prepared with  Dragon/digital dictation along withSmart phrase technology. Any transcriptional errors that result from this process are unintentional.

## 2014-03-25 ENCOUNTER — Encounter (HOSPITAL_COMMUNITY): Payer: Self-pay | Admitting: Gynecology

## 2014-03-26 ENCOUNTER — Telehealth: Payer: Self-pay

## 2014-03-26 NOTE — Telephone Encounter (Signed)
Elora spoke with patient and advised her.

## 2014-03-26 NOTE — Telephone Encounter (Signed)
Had D&C, Hysteroscopy on 03/22/14.  Returned to work yesterday. Walks a lot at her job.  She said today she started bleeding like a period and is having uterine discomfort.  4 on a 1-10 scale.  She took 3 Ibuprofen and got some relief. Some better.  She said her menses was due last week but Dr. Moshe Salisbury had her take Megace for the week before surgery to prevent bleeding.  No fever.  Patient said she feels like this is probably her period but wanted to check with you to make sure all sounds ok.

## 2014-03-26 NOTE — Telephone Encounter (Signed)
Reassure her that this is probably her cycle since she now came off the Megace as well. Ibuprofen/Motrin 3 times a day will help with her cramping and cut down the bleeding as well.

## 2014-04-05 ENCOUNTER — Ambulatory Visit (INDEPENDENT_AMBULATORY_CARE_PROVIDER_SITE_OTHER): Payer: 59 | Admitting: Gynecology

## 2014-04-05 ENCOUNTER — Encounter: Payer: Self-pay | Admitting: Gynecology

## 2014-04-05 ENCOUNTER — Ambulatory Visit: Payer: Self-pay | Admitting: Gynecology

## 2014-04-05 ENCOUNTER — Other Ambulatory Visit: Payer: Self-pay | Admitting: Gynecology

## 2014-04-05 ENCOUNTER — Telehealth: Payer: Self-pay | Admitting: Gynecology

## 2014-04-05 VITALS — BP 124/80

## 2014-04-05 DIAGNOSIS — Z8639 Personal history of other endocrine, nutritional and metabolic disease: Secondary | ICD-10-CM

## 2014-04-05 DIAGNOSIS — Z09 Encounter for follow-up examination after completed treatment for conditions other than malignant neoplasm: Secondary | ICD-10-CM

## 2014-04-05 DIAGNOSIS — Z30431 Encounter for routine checking of intrauterine contraceptive device: Secondary | ICD-10-CM

## 2014-04-05 MED ORDER — LEVONORGESTREL 20 MCG/24HR IU IUD: INTRAUTERINE_SYSTEM | Freq: Once | INTRAUTERINE | Status: DC

## 2014-04-05 NOTE — Progress Notes (Signed)
   Patient presented to the office today for postop exam. Patient status post resectoscopic polypectomy as a result of this surgery uterine bleeding. Patient is doing well. Spotting a few days ago. Patient also with known history of vitamin D deficiency complaining her 12 week treatment this week. She also wanted discuss possible contraceptive options since her husband has not had a vasectomy. She does suffer from heavy cycles as well.  Findings from surgery as well as pathology report and pictures were shared with the patient. Pathology report as follows:  Diagnosis Endometrium, curettage - SECRETORY ENDOMETRIUM, BENIGN ENDOMETRIAL POLYP AND BENIGN MYOMETRIUM. - NO HYPERPLASIA OR CARCINOMA.  Exam: Abdomen: Soft nontender no rebound or guarding Pelvic: Bartholin urethra Skene was within normal limits Vagina: No lesions or discharge Cervix: No lesions or discharge Uterus: Upper limits of normal no palpable masses or tenderness Adnexa: No palpable mass or tenderness Rectal exam: Not done  Assessment/plan: Patient status post resectoscopic polypectomy doing well. Benign pathology. Patient was provided with literature information the Mirena IUD which was her for contraception as well as cycle control. She will make arrangements for sometime the next one to 2 weeks to have it placed. When patient comes in for placement of the Mirena IUD we'll check her vitamin D level. I've asked her to begin taking vitamin D3 2000 units daily after she completes her 12 week treatment.

## 2014-04-05 NOTE — Telephone Encounter (Signed)
04/05/14-I checked pt benefits for Mirena & insertion for contraception and her [place] ins will cover it at 100%, no copay or deductible. Clorine will Call the pt and give her that information in Spanish.wl

## 2014-04-27 ENCOUNTER — Other Ambulatory Visit: Payer: Self-pay | Admitting: Gynecology

## 2014-05-08 ENCOUNTER — Other Ambulatory Visit: Payer: Self-pay | Admitting: Gynecology

## 2014-05-08 DIAGNOSIS — Z1231 Encounter for screening mammogram for malignant neoplasm of breast: Secondary | ICD-10-CM

## 2014-05-15 ENCOUNTER — Telehealth: Payer: Self-pay

## 2014-05-15 NOTE — Telephone Encounter (Signed)
Had D&C Hysteroscopy in Feb 5.  On 2/9 after surgery we had her d/c Megace and at that point suspected that bleeding was her menses since it was due.  She is calling today because she had no period in March at all.  She has checked two home UPTs and both were negative. She is due to come for Mirena IUD but wondered what to do about that since no period in March. Wondered if Megace and all the bleeding she did prior to surgery has thrown her cycles off?  Patient said when we called her regarding TSH back in Nov we sent Rx but told her to recheck 3 mos TSH.  She said when she saw you that you told her no need for recheck. If that is the case then she needs refills on her synthroid as she has none available.  Has been without x 4 days. Ok to refill Synthroid?

## 2014-05-16 ENCOUNTER — Other Ambulatory Visit: Payer: Self-pay | Admitting: Gynecology

## 2014-05-16 ENCOUNTER — Ambulatory Visit (HOSPITAL_COMMUNITY)
Admission: RE | Admit: 2014-05-16 | Discharge: 2014-05-16 | Disposition: A | Discharge status: Home / Self Care | Payer: 59 | Source: Ambulatory Visit | Attending: Gynecology | Admitting: Gynecology

## 2014-05-16 DIAGNOSIS — Z1231 Encounter for screening mammogram for malignant neoplasm of breast: Secondary | ICD-10-CM | POA: Insufficient documentation

## 2014-05-16 DIAGNOSIS — N912 Amenorrhea, unspecified: Secondary | ICD-10-CM

## 2014-05-16 MED ORDER — LEVOTHYROXINE SODIUM 25 MCG PO TABS: ORAL_TABLET | ORAL | Status: DC

## 2014-05-16 NOTE — Telephone Encounter (Signed)
I would recommend her starting back on her Synthroid after she has her refill and one month after being back on the medication to come by to check her TSH

## 2014-05-16 NOTE — Telephone Encounter (Signed)
Give her a refill for her Synthroid. Have her come in to the office for a TSH and a serum pregnancy test. Have her call the office the day after her qualitative hCG and if negative call in prescription for Provera 10 mg to take 1 by mouth daily for 10 days to start her cycle. Then she can make an appointment to have the IUD placed

## 2014-05-16 NOTE — Telephone Encounter (Signed)
Dawn Villegas spoke with patient and informed her of all this.  Orders placed.

## 2014-05-16 NOTE — Telephone Encounter (Signed)
Patient's has been without her Synthroid x 5 days now because Rx ran out with no refills.  Do you still want to check TSH now or will this being without meds x 5 days affect result?

## 2014-05-20 ENCOUNTER — Other Ambulatory Visit: Payer: 59

## 2014-05-20 DIAGNOSIS — N912 Amenorrhea, unspecified: Secondary | ICD-10-CM

## 2014-05-21 LAB — HCG, SERUM, QUALITATIVE: Preg, Serum: NEGATIVE

## 2014-05-24 ENCOUNTER — Telehealth: Payer: Self-pay | Admitting: *Deleted

## 2014-05-24 MED ORDER — MEDROXYPROGESTERONE ACETATE 10 MG PO TABS: 10.0000 mg | ORAL_TABLET | Freq: Every day | ORAL | Status: DC

## 2014-05-24 NOTE — Telephone Encounter (Signed)
PT NOTE ON 05/15/14 IF NEG. HCG SEND RX FOR PROVERA 10 MG DAILY X 10 DAYS. IT WAS NEGATIVE ON 05/20/14. NO CYCLE YET RX WILL BE SENT

## 2014-06-03 ENCOUNTER — Ambulatory Visit (INDEPENDENT_AMBULATORY_CARE_PROVIDER_SITE_OTHER): Payer: 59 | Admitting: Gynecology

## 2014-06-03 ENCOUNTER — Encounter: Payer: Self-pay | Admitting: Gynecology

## 2014-06-03 VITALS — BP 120/76

## 2014-06-03 DIAGNOSIS — Z975 Presence of (intrauterine) contraceptive device: Secondary | ICD-10-CM | POA: Insufficient documentation

## 2014-06-03 DIAGNOSIS — Z30430 Encounter for insertion of intrauterine contraceptive device: Secondary | ICD-10-CM

## 2014-06-03 NOTE — Progress Notes (Signed)
   Patient presented to the office today for placement of Mirena IUD. Previous literature information provided. Patient currently menstruating.                                                                    IUD procedure note       Patient presented to the office today for placement of Mirena IUD. The patient had previously been provided with literature information on this method of contraception. The risks benefits and pros and cons were discussed and all her questions were answered. She is fully aware that this form of contraception is 99% effective and is good for 5 years.  Pelvic exam: Bartholin urethra Skene glands: Within normal limits Vagina: No lesions or discharge Cervix: No lesions or discharge Uterus: anteverted position Adnexa: No masses or tenderness Rectal exam: Not done  The cervix was cleansed with Betadine solution. A single-tooth tenaculum was placed on the anterior cervical lip. The uterus sounded to 8 1/2  centimeter. The IUD was shown to the patient and inserted in a sterile fashion. The IUD string was trimmed. The single-tooth tenaculum was removed. Patient was instructed to return back to the office in one month for follow up.       Lot No. [id number]

## 2014-06-03 NOTE — Patient Instructions (Signed)
Colocacin de un dispositivo intrauterino - Cuidados posteriores (Intrauterine Device Insertion, Care After) Siga estas instrucciones durante las prximas semanas. Estas indicaciones le proporcionan informacin general acerca de cmo deber cuidarse despus del procedimiento. El mdico tambin podr darle instrucciones ms especficas. El tratamiento ha sido planificado segn las prcticas mdicas actuales, pero en algunos casos pueden ocurrir problemas. Comunquese con el mdico si tiene algn problema o tiene dudas despus del procedimiento. QU ESPERAR DESPUS DEL PROCEDIMIENTO La insercin del DIU puede causar molestias, como clicos. que deberan mejorar una vez que el DIU est en su lugar. Podr tener sangrado despus del procedimiento. Esto es normal. Vara desde un sangrado ligero durante un par de [employer] un sangrado similar al menstrual. Cuando el DIU est en su lugar, se extender un hilo de 1 a 2pulgadas (2,5 a 5cm) por el cuello del tero en la vagina. El hilo no debera molestarle a usted ni a su pareja. De lo contrario, consulte con su mdico.  INSTRUCCIONES PARA EL CUIDADO EN EL HOGAR   Controle su DIU para asegurarse de que est en su lugar, antes de reanudar la actividad sexual. Tiene que sentir los hilos. Si no los siente, algo puede estar mal. El DIU puede haberse salido del tero o ste puede haber sido atravesado (perforado) durante la colocacin. Adems, si los hilos son ms largos, puede significar que el DIU se est saliendo del tero. Si ocurre alguno de [employer], no estar protegida y podr Product/process development scientist.  Puede volver a Clinical biochemist si no tiene problemas con el DIU. El DIU de cobre se considera efectivo y funciona de inmediato, si se inserta dentro de los 7 das del inicio del perodo. Ser necesario que utilice un mtodo anticonceptivo adicional durante 7 [place], si el DIU se inserta en algn otro momento del ciclo.  Controle que el DIU sigue en su  lugar sintiendo los hilos despus de cada perodo menstrual.  Es posible que necesite tomar analgsicos, como acetaminofeno o ibuprofeno. Tome todos los medicamentos como le indic el mdico. SOLICITE ATENCIN MDICA SI:   Tiene un sangrado ms abundante o dura ms de un ciclo menstrual normal.  Tiene fiebre.  Siente clicos o dolor abdominal que no se alivian con medicamentos.  Siente dolor abdominal que no parece estar relacionado con el rea en que senta los clicos y Conservation officer, historic buildings anteriormente.  Se siente mareada, inusualmente dbil o se desmaya.  Tiene flujo vaginal u olores anormales.  Siente dolor durante las [employer].  No puede sentir los hilos del DIU o los siente ms largos.  Siente que el DIU est en la abertura del cuello del tero, en la vagina.  Piensa que est embarazada o no tiene su perodo menstrual.  El hilo del DIU est lastimando a su pareja sexual. ASEGRESE DE QUE:  Comprende estas instrucciones.  Controlar su afeccin.  Recibir ayuda de inmediato si no mejora o si empeora. Document Released: 10/27/2011 Document Revised: 11/22/2012 [facility] Patient Information 2015 [place]. This information is not intended to replace advice given to you by your health care provider. Make sure you discuss any questions you have with your health care provider.

## 2014-06-17 ENCOUNTER — Encounter: Payer: Self-pay | Admitting: Gastroenterology

## 2014-06-17 ENCOUNTER — Ambulatory Visit (INDEPENDENT_AMBULATORY_CARE_PROVIDER_SITE_OTHER): Payer: 59 | Admitting: Gastroenterology

## 2014-06-17 VITALS — BP 118/62 | HR 74 | Ht 61.25 in | Wt 153.4 lb

## 2014-06-17 DIAGNOSIS — Z1211 Encounter for screening for malignant neoplasm of colon: Secondary | ICD-10-CM

## 2014-06-17 DIAGNOSIS — R1013 Epigastric pain: Secondary | ICD-10-CM

## 2014-06-17 MED ORDER — FAMOTIDINE 20 MG PO TABS
20.0000 mg | ORAL_TABLET | Freq: Two times a day (BID) | ORAL | Status: DC
Start: 2014-06-17 — End: 2015-03-14

## 2014-06-17 MED ORDER — NA SULFATE-K SULFATE-MG SULF 17.5-3.13-1.6 GM/177ML PO SOLN: 1.0000 | Freq: Once | ORAL | Status: DC

## 2014-06-17 NOTE — Progress Notes (Signed)
_                                                                                                                History of Present Illness:  Dawn Villegas is a pleasant Hispanic female referred at the request of Dr. Toney Rakes for evaluation of upper GI complaints and for screening colonoscopy.  She complains of postprandial distention with excess belching and flatus.  She denies abdominal pain, per se.  There is no history of dysphagia or nausea.  While she denies change in bowel habits, she does have more urgency than in the past.  There is no history of rectal bleeding.   Past Medical History  Diagnosis Date  . Hypothyroidism   . Hyperlipidemia   . SVD (spontaneous vaginal delivery)     x 4  . Insomnia    Past Surgical History  Procedure Laterality Date  . Dilation and curettage of uterus      x 1 mab  . Dilatation & curettage/hysteroscopy with myosure N/A 03/22/2014    Procedure: DILATATION & CURETTAGE/HYSTEROSCOPY WITH MYOSURE-RESECTOSCOPIC POLYPECTOMY;  Surgeon: Terrance Mass, MD;  Location: [facility];  Service: Gynecology;  Laterality: N/A;   family history includes Diabetes in her father; Heart disease in her father; Hypertension in her father and mother. There is no history of Colon cancer, Colon polyps, or Esophageal cancer. Current Outpatient Prescriptions  Medication Sig Dispense Refill  . atorvastatin (LIPITOR) 10 MG tablet Take 1 tablet (10 mg total) by mouth daily. 30 tablet 11  . doxylamine, Sleep, (UNISOM) 25 MG tablet Take 25 mg by mouth at bedtime as needed for sleep.     . Ergocalciferol (DRISDOL PO) Take 2,000 Units by mouth daily.    Marland Kitchen levothyroxine (SYNTHROID, LEVOTHROID) 25 MCG tablet TAKE 1 TABLET (25 MCG TOTAL) BY MOUTH DAILY BEFORE BREAKFAST. 30 tablet 3   Current Facility-Administered Medications  Medication Dose Route Frequency Provider Last Rate Last Dose  . levonorgestrel (MIRENA) 20 MCG/24HR IUD   Intrauterine Once Terrance Mass, MD        Allergies as of 06/17/2014  . (No Known Allergies)    reports that she has never smoked. She has never used smokeless tobacco. She reports that she drinks alcohol. She reports that she does not use illicit drugs.   Review of Systems: Pertinent positive and negative review of systems were noted in the above HPI section. All other review of systems were otherwise negative.  Vital signs were reviewed in today's medical record Physical Exam: General: Well developed , well nourished, no acute distress Skin: anicteric Head: Normocephalic and atraumatic Eyes:  sclerae anicteric, EOMI Ears: Normal auditory acuity Mouth: No deformity or lesions Neck: Supple, no masses or thyromegaly Lymph Nodes: no lymphadenopathy Lungs: Clear throughout to auscultation Heart: Regular rate and rhythm; no murmurs, rubs or bruits Gastroinestinal: Soft, non tender and non distended. No masses, hepatosplenomegaly or hernias noted. Normal Bowel sounds Rectal:deferred Musculoskeletal: Symmetrical with no gross deformities  Skin: No lesions on visible extremities  Pulses:  Normal pulses noted Extremities: No clubbing, cyanosis, edema or deformities noted Neurological: Alert oriented x 4, grossly nonfocal Cervical Nodes:  No significant cervical adenopathy Inguinal Nodes: No significant inguinal adenopathy Psychological:  Alert and cooperative. Normal mood and affect  See Assessment and Plan under Problem List

## 2014-06-17 NOTE — Assessment & Plan Note (Signed)
Postprandial distention with excess gas is likely due to ulcer or nonulcer dyspepsia.    Recommendations #1 trial of Pepcid 20 mg daily; if not improved may consider upper endoscopy

## 2014-06-17 NOTE — Assessment & Plan Note (Addendum)
Plan screening colonoscopy  Cc Dr. Toney Rakes

## 2014-06-17 NOTE — Patient Instructions (Signed)

## 2014-06-20 ENCOUNTER — Encounter: Payer: Self-pay | Admitting: Gastroenterology

## 2014-06-28 ENCOUNTER — Ambulatory Visit (AMBULATORY_SURGERY_CENTER): Payer: 59 | Admitting: Gastroenterology

## 2014-06-28 ENCOUNTER — Encounter: Payer: Self-pay | Admitting: Gastroenterology

## 2014-06-28 VITALS — BP 97/64 | HR 58 | Temp 97.2°F | Resp 39 | Ht 61.0 in | Wt 153.0 lb

## 2014-06-28 DIAGNOSIS — Z1211 Encounter for screening for malignant neoplasm of colon: Secondary | ICD-10-CM

## 2014-06-28 DIAGNOSIS — K5730 Diverticulosis of large intestine without perforation or abscess without bleeding: Secondary | ICD-10-CM

## 2014-06-28 MED ORDER — SODIUM CHLORIDE 0.9 % IV SOLN: 500.0000 mL | INTRAVENOUS | Status: DC

## 2014-06-28 NOTE — Op Note (Signed)
[place]  [address]. [place], [id number]   COLONOSCOPY PROCEDURE REPORT  PATIENT: Dawn Villegas, Dawn Villegas  MR#: [id number] BIRTHDATE: Oct 24, 1965 , 49  yrs. old GENDER: female ENDOSCOPIST: Inda Castle, MD REFERRED AO:ZHYQ Fernandez, M.D. PROCEDURE DATE:  06/28/2014 PROCEDURE:   Colonoscopy, screening First Screening Colonoscopy - Avg.  risk and is 50 yrs.  old or older Yes.  Prior Negative Screening - Now for repeat screening. N/A  History of Adenoma - Now for follow-up colonoscopy & has been > or = to 3 yrs.  N/A  Polyps removed today? No Recommend repeat exam, <10 yrs? No ASA CLASS:   Class II INDICATIONS:Colorectal Neoplasm Risk Assessment for this procedure is average risk. MEDICATIONS: Monitored anesthesia care and Propofol 200 mg IV  DESCRIPTION OF PROCEDURE:   After the risks benefits and alternatives of the procedure were thoroughly explained, informed consent was obtained.  The digital rectal exam revealed no abnormalities of the rectum.   The [id number]  endoscope was introduced through the anus and advanced to the ileum. No adverse events experienced.   The quality of the prep was (Suprep was used) excellent.  The instrument was then slowly withdrawn as the colon was fully examined.      COLON FINDINGS: There was mild diverticulosis noted at the cecum and in the ascending colon.   The examination was otherwise normal. Retroflexed views revealed no abnormalities. The time to cecum = 5.6 Withdrawal time = 6.7   The scope was withdrawn and the procedure completed. COMPLICATIONS: There were no immediate complications.  ENDOSCOPIC IMPRESSION: 1.   Mild diverticulosis was noted at the cecum and in the ascending colon 2.   The examination was otherwise normal  RECOMMENDATIONS: Continue current colorectal screening recommendations for "routine risk" patients with a repeat colonoscopy in 10 years.  eSigned:  Inda Castle, MD  06/28/2014 9:13 AM   cc:

## 2014-06-28 NOTE — Progress Notes (Signed)
Report to PACU, RN, vss, BBS= Clear.  

## 2014-06-28 NOTE — Patient Instructions (Signed)
YOU HAD AN ENDOSCOPIC PROCEDURE TODAY AT [facility]:   Refer to the procedure report that was given to you for any specific questions about what was found during the examination.  If the procedure report does not answer your questions, please call your gastroenterologist to clarify.  If you requested that your care partner not be given the details of your procedure findings, then the procedure report has been included in a sealed envelope for you to review at your convenience later.  YOU SHOULD EXPECT: Some feelings of bloating in the abdomen. Passage of more gas than usual.  Walking can help get rid of the air that was put into your GI tract during the procedure and reduce the bloating. If you had a lower endoscopy (such as a colonoscopy or flexible sigmoidoscopy) you may notice spotting of blood in your stool or on the toilet paper. If you underwent a bowel prep for your procedure, you may not have a normal bowel movement for a few days.  Please Note:  You might notice some irritation and congestion in your nose or some drainage.  This is from the oxygen used during your procedure.  There is no need for concern and it should clear up in a day or so.  SYMPTOMS TO REPORT IMMEDIATELY:   Following lower endoscopy (colonoscopy or flexible sigmoidoscopy):  Excessive amounts of blood in the stool  Significant tenderness or worsening of abdominal pains  Swelling of the abdomen that is new, acute  Fever of 100F or higher For urgent or emergent issues, a gastroenterologist can be reached at any hour by calling [phone].  DIET: Your first meal following the procedure should be a small meal and then it is ok to progress to your normal diet. Heavy or fried foods are harder to digest and may make you feel nauseous or bloated.  Likewise, meals heavy in dairy and vegetables can increase bloating.  Drink plenty of fluids but you should avoid alcoholic beverages for 24 hours.  ACTIVITY:   You should plan to take it easy for the rest of today and you should NOT DRIVE or use heavy machinery until tomorrow (because of the sedation medicines used during the test).    FOLLOW UP: Our staff will call the number listed on your records the next business day following your procedure to check on you and address any questions or concerns that you may have regarding the information given to you following your procedure. If we do not reach you, we will leave a message.  However, if you are feeling well and you are not experiencing any problems, there is no need to return our call.  We will assume that you have returned to your regular daily activities without incident.  SIGNATURES/CONFIDENTIALITY: You and/or your care partner have signed paperwork which will be entered into your electronic medical record.  These signatures attest to the fact that that the information above on your After Visit Summary has been reviewed and is understood.  Full responsibility of the confidentiality of this discharge information lies with you and/or your care-partner.  Please continue your normal medications  Please read over handouts about diverticulosis and high fiber diets

## 2014-07-01 ENCOUNTER — Telehealth: Payer: Self-pay | Admitting: *Deleted

## 2014-07-01 NOTE — Telephone Encounter (Signed)
Husband phone number was left, he does not speak english, can not communicate if pt is ok-adm

## 2014-07-01 NOTE — Telephone Encounter (Signed)
  Follow up Call-  Call back number 06/28/2014  Post procedure Call Back phone  # [phone]  Permission to leave phone message Yes     Patient questions:  Do you have a fever, pain , or abdominal swelling? No. Pain Score  0 *  Have you tolerated food without any problems? Yes.    Have you been able to return to your normal activities? Yes.    Do you have any questions about your discharge instructions: Diet   No. Medications  No. Follow up visit  No.  Do you have questions or concerns about your Care? No.  Actions: * If pain score is 4 or above: No action needed, pain <4.  Spoke with patient via Telephonic interpreter.

## 2014-07-05 ENCOUNTER — Ambulatory Visit (INDEPENDENT_AMBULATORY_CARE_PROVIDER_SITE_OTHER): Payer: 59 | Admitting: Gynecology

## 2014-07-05 VITALS — BP 108/70

## 2014-07-05 DIAGNOSIS — Z30431 Encounter for routine checking of intrauterine contraceptive device: Secondary | ICD-10-CM | POA: Diagnosis not present

## 2014-07-05 NOTE — Progress Notes (Signed)
   Patient presented to the office for her 1 month follow-up after having placed the Mirena IUD. She has had some spotting but not has decreased. Otherwise has done well. She has informing the recently she had her screening colonoscopy at the age of 29 because her sister U age had multiple colon polyps. She reports that her colonoscopy was negative.  Exam: Blood pressure 108/70 Gen. appearance well-developed well-nourished female in no acute distress Pelvic: Bartholin urethra Skene was within normal limits Vagina: No lesions or discharge Cervix: IUD visualized and clot was removed. Bimanual exam uterus anteverted normal size shape and consistency nontender Adnexa: No palpable masses or tenderness Rectal vaginal exam not done  Assessment/plan: Patient one month status post placement of Mirena IUD doing well. Patient scheduled to return back to the office at the end of the year for her annual exam or when necessary.

## 2014-07-19 ENCOUNTER — Encounter: Payer: Self-pay | Admitting: Gynecology

## 2014-07-19 ENCOUNTER — Ambulatory Visit (INDEPENDENT_AMBULATORY_CARE_PROVIDER_SITE_OTHER): Payer: 59 | Admitting: Gynecology

## 2014-07-19 VITALS — BP 126/82

## 2014-07-19 DIAGNOSIS — B373 Candidiasis of vulva and vagina: Secondary | ICD-10-CM | POA: Diagnosis not present

## 2014-07-19 DIAGNOSIS — N9089 Other specified noninflammatory disorders of vulva and perineum: Secondary | ICD-10-CM

## 2014-07-19 DIAGNOSIS — B3731 Acute candidiasis of vulva and vagina: Secondary | ICD-10-CM

## 2014-07-19 LAB — WET PREP FOR TRICH, YEAST, CLUE
Clue Cells Wet Prep HPF POC: NONE SEEN
TRICH WET PREP: NONE SEEN

## 2014-07-19 MED ORDER — FLUCONAZOLE 150 MG PO TABS: ORAL_TABLET | ORAL | Status: DC

## 2014-07-19 MED ORDER — CLOBETASOL PROPIONATE 0.05 % EX CREA: 1.0000 "application " | TOPICAL_CREAM | Freq: Two times a day (BID) | CUTANEOUS | Status: DC

## 2014-07-19 NOTE — Patient Instructions (Signed)
Fluconazole tablets Qu es este medicamento? El FLUCONAZOL es un medicamento antimictico. Se utiliza para tratar ciertos tipos de infecciones micticas o por levadura. Este medicamento puede ser utilizado para otros usos; si tiene alguna pregunta consulte con su proveedor de atencin mdica o con su farmacutico. MARCAS COMERCIALES DISPONIBLES: Diflucan Qu le debo informar a mi profesional de la salud antes de tomar este medicamento? Necesita saber si usted presenta alguno de los siguientes problemas o situaciones: -antecedentes de pulso cardaco irregular -enfermedad renal -una reaccin alrgica o inusual al fluconazol, a otros azoles u otros medicamentos, alimentos, colorantes o conservantes -si est embarazada o buscando quedar embarazada -si est amamantando a un beb Cmo debo utilizar este medicamento? Tome este medicamento por va oral. Siga las instrucciones de la etiqueta del [place]. No tome su medicamento con una frecuencia mayor a la indicada. Hable con su pediatra para informarse acerca del uso de este medicamento en nios. Puede requerir atencin especial. Este medicamento ha sido recetado a nios tan menores como de 6 meses de [place]. Sobredosis: Pngase en contacto inmediatamente con un centro toxicolgico o una sala de urgencia si usted cree que haya tomado demasiado medicamento. ATENCIN: [employer] es solo para usted. No comparta este medicamento con nadie. Qu sucede si me olvido de una dosis? Si olvida una dosis, tmela lo antes posible. Si es casi la hora de la prxima dosis, tome slo esa dosis. No tome dosis adicionales o dobles. Qu puede interactuar con este medicamento? No tome esta medicina con ninguno de los siguientes medicamentos: -astemizol -ciertos medicamentos para el pulso cardiaco irregular, tales como dofetilida, dronedarona, quinidina -cisapride -eritromicina -lomitapida -otros medicamentos que prolongan el intervalo QT (causa un ritmo  cardiaco anormal) -pimozida -terfenadina -tioridazina -tolvaptn -ziprasidona Esta medicina tambin puede interactuar con los siguientes medicamentos: -medicamentos antivirales para el VIH o SIDA -pldoras anticonceptivas -ciertos antibiticos, tales como rifabutina, rifampicina -ciertos medicamentos para la presin sangunea, tales como amlodipina, isradipina, felodipino, hidroclorotiazida, losartn, nifedipina -ciertos medicamentos para el cncer, tales como ciclofosfamida, vinblastina, vincristina -ciertos medicamentos para el colesterol, tales como atorvastatina, lovastatina, fluvastatina, simvastatina -ciertos medicamentos para la depresin, ansiedad o trastornos psicticos, tales como amitriptilina, midazolam, nortriptilina, triazolam -ciertos medicamentos para la diabetes, tales como glipizida, gliburida, tolbutamida -ciertos medicamentos para Conservation officer, historic buildings, tales como alfentanilo, fentanilo, metadona -ciertos medicamentos para convulsiones, tales como carbamazepina, fenitona -ciertos medicamentos que tratan o previenen cogulos sanguneos, tales como warfarina -halofantrina -medicamentos que reducen su capacidad de combatir infecciones, tales como ciclosporina, prednisona, tacrolimo -los AINE, medicamentos para el dolor o inflamacin, tales como celecoxib, diclofenaco, flurbiprofeno, ibuprofeno, meloxicam, naproxeno -otros medicamentos para infecciones micticas -sirolims -teofilina -tofacitinib Puede ser que esta lista no menciona todas las posibles interacciones. Informe a su profesional de [employer]	[place] de [employer] productos a base de hierbas, medicamentos de [place] o suplementos nutritivos que est tomando. Si usted fuma, consume bebidas alcohlicas o si utiliza drogas ilegales, indqueselo tambin a su profesional de [employer]	[place]. Algunas sustancias pueden interactuar con su medicamento. A qu debo estar atento al usar [employer]? Visite a su mdico o a su profesional de la  salud para chequear su evolucin peridicamente. Si toma este medicamento durante un perodo de [employer], podr Customer service manager anlisis de [place]. Si los sntomas no mejoran, consulte a su mdico. Pueden transcurrir [employer] o meses de tratamiento antes de que algunas infecciones micticas se curen. El alcohol puede aumentar la posibilidad de daos al hgado. Evite consumir bebidas alcohlicas. Si tiene una infeccin vaginal, no tenga relaciones  sexuales hasta que haya completado el tratamiento. Puede usar una toallita higinica. No use tampones. Use ropa interior de algodn no sintticos, y recin lavadas. Qu efectos secundarios puedo tener al [employer] este medicamento? Efectos secundarios que debe informar a su mdico o a Barrister's clerk de la salud tan pronto como sea posible: -Chief of Staff como erupcin cutnea, picazn o urticarias, hinchazn de los labios, boca, lengua o garganta -orina de color amarillo oscuro -sensacin de mareos o desmayos -pulso cardiaco irregular o dolor en el pecho -enrojecimiento, formacin de ampollas, descamacin o distensin de la piel, inclusive dentro de la boca -dificultad al respirar -sangrado o magulladuras inusuales -vmito -color amarillento de los ojos o la piel Efectos secundarios que, por lo general, no requieren Geophysical data processor (debe informarlos a su mdico o a Barrister's clerk de la salud si persisten o si son molestos): -cambios en el sabor de los alimentos -diarrea -dolor de cabeza -Higher education careers adviser o nuseas Puede ser que esta lista no menciona todos los posibles efectos secundarios. Comunquese a su mdico por asesoramiento mdico [employer] Usted puede informar los efectos secundarios a la FDA por telfono al 1-800-FDA-1088. Dnde debo guardar mi medicina? Mantngala fuera del alcance de los nios. Gurdela a [employer], a menos de 30 grados C (32 grados F). Deseche todo el medicamento  que no haya utilizado, despus de la fecha de vencimiento. ATENCIN: Este folleto es un resumen. Puede ser que no cubra toda la posible informacin. Si usted tiene preguntas acerca de esta medicina, consulte con su mdico, su farmacutico o su profesional de Technical sales engineer.  2015, Elsevier/Gold Standard. (2012-10-26 14:13:08) Clobetasol Propionate skin cream Qu es este medicamento? El CLOBETASOL es un corticosteroide. Se utiliza sobre la piel para tratar los problemas de la piel que estn acompaados de picazn, enrojecimiento e hinchazn. Este medicamento puede ser utilizado para otros usos; si tiene alguna pregunta consulte con su proveedor de atencin mdica o con su farmacutico. MARCAS COMERCIALES DISPONIBLES: [place], Embeline, Embeline E, Temovate, Temovate E Qu le debo informar a mi profesional de la salud antes de tomar este medicamento? Necesita saber si usted presenta alguno de los siguientes problemas o situaciones: -algn tipo de infeccin activa, incluyendo sarampin, tuberculosis, herpes o varicela -problemas circulatorios o enfermedad vascular -grandes zonas de piel quemada o con lesiones -roscea -desgaste o adelgazamiento de la piel -una reaccin alrgica o inusual al clobetasol, a los corticosteroides, a otros medicamentos, alimentos, colorantes o conservantes -si est embarazada o buscando quedar embarazada -si est amamantando a un beb Cmo debo utilizar este medicamento? Este medicamento es slo para uso externo. No lo ingiera por va oral. Siga las instrucciones de la etiqueta del medicamento. Lvese las manos antes y despus de usarlo. Aplique una pequea capa sobre las zonas afectadas. No la cubra con un vendaje o apsito a menos que se lo indique su mdico o su profesional de Technical sales engineer. Evite que el medicamento entre en contacto con sus ojos. Si esto ocurre, enjuguelos con abundante agua fra del grifo. Es importante que no utilice ms medicamento que lo indicado. No  utilice su medicamento con una frecuencia mayor a la indicada. Si lo hace aumentarn las probabilidades de sufrir efectos secundarios. Hable con su pediatra para informarse acerca del uso de este medicamento en nios. Puede requerir Sales executive. Los pacientes de edad avanzada son ms propensos a sufrir lesiones en la piel debido al envejecimiento y Air traffic controller puede aumentar los efectos secundarios. Este medicamento debe utilizarse solamente durante perodos breves y  con muy poca frecuencia en pacientes de edad avanzada. Sobredosis: Pngase en contacto inmediatamente con un centro toxicolgico o una sala de urgencia si usted cree que haya tomado demasiado medicamento. ATENCIN: [employer] es solo para usted. No comparta este medicamento con nadie. Qu sucede si me olvido de una dosis? Si olvida una dosis, sela lo antes posible. Si es casi la hora de la prxima dosis, aplique slo esa dosis. No use dosis adicionales o dobles. Qu puede interactuar con este medicamento? No se esperan interacciones. No utilice cosmticos u otros productos para la piel sobre la zona que est siendo tratada. Puede ser que esta lista no menciona todas las posibles interacciones. Informe a su profesional de [employer]	[place] de [employer] productos a base de hierbas, medicamentos de [place] o suplementos nutritivos que est tomando. Si usted fuma, consume bebidas alcohlicas o si utiliza drogas ilegales, indqueselo tambin a su profesional de [employer]	[place]. Algunas sustancias pueden interactuar con su medicamento. A qu debo estar atento al usar [employer]? Si los sntomas no mejoran dentro de 2 semanas o si experimenta irritacin de la piel, informe a su mdico o a su profesional de [employer]	[place]. Informe a su mdico o su profesional de la salud si est en contacto con personas con sarampin o varicela, o si desarrolla llagas o ampollas que no se curan bien. Qu efectos secundarios puedo tener al [employer] este  medicamento? Efectos secundarios que debe informar a su mdico o a Barrister's clerk de la salud tan pronto como sea posible: -Chief of Staff como erupcin cutnea, picazn o urticarias, hinchazn de la cara, labios o lengua -cambios en la visin -cuando el problema de la piel no se cura -formacin de ampollas llenas de pus, rojas y dolorosas en la piel o en los folculos pilosos -adelgazamiento de la piel y fcil formacin de magulladuras Efectos secundarios que, por lo general, no requieren atencin mdica (debe informarlos a su mdico o a su profesional de la salud si persisten o si son molestos): -ardor, irritacin de la piel -enrojecimiento o descamacin de la piel Puede ser que esta lista no menciona todos los posibles efectos secundarios. Comunquese a su mdico por asesoramiento mdico [employer] Usted puede informar los efectos secundarios a la FDA por telfono al 1-800-FDA-1088. Dnde debo guardar mi medicina? Mantngala fuera del alcance de los nios. Gurdela a [employer], entre 15 y 67 grados C (63 y 37 grados F). Mantngala lejos del calor y de la luz directa. No la congele. Deseche todo el medicamento que no haya utilizado, despus de la fecha de vencimiento. ATENCIN: Este folleto es un resumen. Puede ser que no cubra toda la posible informacin. Si usted tiene preguntas acerca de esta medicina, consulte con su mdico, su farmacutico o su profesional de Technical sales engineer.  2015, Elsevier/Gold Standard. (2006-04-11 13:07:00)

## 2014-07-19 NOTE — Progress Notes (Signed)
   Patient presented to the office today complaining of vulvar irritation but no vaginal discharge. Patient with very light cycles on her IUD. Patient in a monogamous relationship. Patient denied any dysuria, frequency, back pain, fever, nausea, or vomiting.  Exam:  Blood pressure 126/82 Well developed well nourished female with the above-mentioned complaint External genitalia in the crural folds bilateral there was erythema and excoriation from scratching but no plaques or vesicles noted. Afterwards a speculum was introduced into the vagina and a slight white discharge was noted otherwise no other abnormality. The cervix no gross lesions and the IUD was seen. Bimanual exam not done Rectal exam: Not done  Wet prep few yeast were noted  Assessment/plan: Yeast vaginitis she will be prescribed Diflucan 150 mg one by mouth today. She will be prescribed clobetasol 0.05% to apply externally twice a day to 3 times a day for 7-10 days for her severe dermatitis. She was asked to hold off her statin on the day that she was to take her Diflucan

## 2014-09-09 ENCOUNTER — Telehealth: Payer: Self-pay | Admitting: *Deleted

## 2014-09-09 MED ORDER — ATORVASTATIN CALCIUM 10 MG PO TABS: 10.0000 mg | ORAL_TABLET | Freq: Every day | ORAL | Status: DC

## 2014-09-09 MED ORDER — LEVOTHYROXINE SODIUM 25 MCG PO TABS: 25.0000 ug | ORAL_TABLET | Freq: Every day | ORAL | Status: DC

## 2014-09-09 NOTE — Telephone Encounter (Signed)
Both Rx sent for early refill. annual due in Nov 2016

## 2014-09-09 NOTE — Telephone Encounter (Signed)
-----   Message from [name] sent at 09/09/2014  3:27 PM EDT ----- Regarding: nurse call Contact: [phone] Patient is going to [place] for a month and half and she needs another refill of her meds. She already picked up this months RX but needs jF to authorize for her to pick up 1 more refill so she wont run out while she is in [place]. Her RX is levothyroxine (SYNTHROID, LEVOTHROID) 25 MCG tablet and atorvastatin (LIPITOR) 10 MG tablet send to [id number] - [place], [place] [address] Thx

## 2014-10-05 ENCOUNTER — Other Ambulatory Visit: Payer: Self-pay | Admitting: Gynecology

## 2014-10-07 NOTE — Telephone Encounter (Signed)
She needs complete annual exam in November

## 2014-12-29 ENCOUNTER — Other Ambulatory Visit: Payer: Self-pay | Admitting: Gynecology

## 2015-02-25 ENCOUNTER — Other Ambulatory Visit: Payer: Self-pay | Admitting: Gynecology

## 2015-03-03 ENCOUNTER — Other Ambulatory Visit: Payer: Self-pay | Admitting: Gynecology

## 2015-03-04 ENCOUNTER — Telehealth: Payer: Self-pay | Admitting: *Deleted

## 2015-03-04 NOTE — Telephone Encounter (Signed)
error 

## 2015-03-14 ENCOUNTER — Encounter: Payer: Self-pay | Admitting: Gynecology

## 2015-03-14 ENCOUNTER — Other Ambulatory Visit (HOSPITAL_COMMUNITY)
Admission: RE | Admit: 2015-03-14 | Discharge: 2015-03-14 | Disposition: A | Discharge status: Home / Self Care | Payer: 59 | Source: Ambulatory Visit | Attending: Gynecology | Admitting: Gynecology

## 2015-03-14 ENCOUNTER — Ambulatory Visit (INDEPENDENT_AMBULATORY_CARE_PROVIDER_SITE_OTHER): Payer: 59 | Admitting: Gynecology

## 2015-03-14 VITALS — BP 124/80 | Ht 61.0 in | Wt 164.0 lb

## 2015-03-14 DIAGNOSIS — B373 Candidiasis of vulva and vagina: Secondary | ICD-10-CM

## 2015-03-14 DIAGNOSIS — R635 Abnormal weight gain: Secondary | ICD-10-CM

## 2015-03-14 DIAGNOSIS — B9689 Other specified bacterial agents as the cause of diseases classified elsewhere: Secondary | ICD-10-CM

## 2015-03-14 DIAGNOSIS — Z01419 Encounter for gynecological examination (general) (routine) without abnormal findings: Secondary | ICD-10-CM | POA: Diagnosis not present

## 2015-03-14 DIAGNOSIS — N898 Other specified noninflammatory disorders of vagina: Secondary | ICD-10-CM | POA: Diagnosis not present

## 2015-03-14 DIAGNOSIS — E038 Other specified hypothyroidism: Secondary | ICD-10-CM | POA: Diagnosis not present

## 2015-03-14 DIAGNOSIS — N760 Acute vaginitis: Secondary | ICD-10-CM

## 2015-03-14 DIAGNOSIS — B359 Dermatophytosis, unspecified: Secondary | ICD-10-CM | POA: Diagnosis not present

## 2015-03-14 DIAGNOSIS — E785 Hyperlipidemia, unspecified: Secondary | ICD-10-CM | POA: Diagnosis not present

## 2015-03-14 DIAGNOSIS — Z8639 Personal history of other endocrine, nutritional and metabolic disease: Secondary | ICD-10-CM

## 2015-03-14 DIAGNOSIS — A499 Bacterial infection, unspecified: Secondary | ICD-10-CM | POA: Diagnosis not present

## 2015-03-14 DIAGNOSIS — B3731 Acute candidiasis of vulva and vagina: Secondary | ICD-10-CM

## 2015-03-14 LAB — LIPID PANEL
Cholesterol: 201 mg/dL — ABNORMAL HIGH (ref 125–200)
HDL: 65 mg/dL (ref >=46)
LDL Cholesterol: 123 mg/dL (ref <130)
Total CHOL/HDL Ratio: 3.1 Ratio (ref <=5.0)
Triglycerides: 64 mg/dL (ref <150)
VLDL: 13 mg/dL (ref <30)

## 2015-03-14 LAB — CBC WITH DIFFERENTIAL/PLATELET
Basophils Absolute: 0 10*3/uL (ref 0.0–0.1)
Basophils Relative: 0 % (ref 0–1)
EOS ABS: 0.2 10*3/uL (ref 0.0–0.7)
EOS PCT: 2 % (ref 0–5)
HCT: 39.8 % (ref 36.0–46.0)
Hemoglobin: 13 g/dL (ref 12.0–15.0)
LYMPHS ABS: 2.2 10*3/uL (ref 0.7–4.0)
Lymphocytes Relative: 25 % (ref 12–46)
MCH: 28.1 pg (ref 26.0–34.0)
MCHC: 32.7 g/dL (ref 30.0–36.0)
MCV: 86.1 fL (ref 78.0–100.0)
MONOS PCT: 6 % (ref 3–12)
MPV: 11 fL (ref 8.6–12.4)
Monocytes Absolute: 0.5 10*3/uL (ref 0.1–1.0)
Neutro Abs: 6 10*3/uL (ref 1.7–7.7)
Neutrophils Relative %: 67 % (ref 43–77)
Platelets: 341 10*3/uL (ref 150–400)
RBC: 4.62 MIL/uL (ref 3.87–5.11)
RDW: 15.3 % (ref 11.5–15.5)
WBC: 8.9 10*3/uL (ref 4.0–10.5)

## 2015-03-14 LAB — COMPREHENSIVE METABOLIC PANEL
ALT: 14 U/L (ref 6–29)
AST: 17 U/L (ref 10–35)
Albumin: 4 g/dL (ref 3.6–5.1)
Alkaline Phosphatase: 62 U/L (ref 33–115)
BUN: 13 mg/dL (ref 7–25)
CHLORIDE: 101 mmol/L (ref 98–110)
CO2: 23 mmol/L (ref 20–31)
Calcium: 9.1 mg/dL (ref 8.6–10.2)
Creat: 0.75 mg/dL (ref 0.50–1.10)
GLUCOSE: 84 mg/dL (ref 65–99)
POTASSIUM: 4.2 mmol/L (ref 3.5–5.3)
Sodium: 136 mmol/L (ref 135–146)
Total Bilirubin: 0.4 mg/dL (ref 0.2–1.2)
Total Protein: 7 g/dL (ref 6.1–8.1)

## 2015-03-14 LAB — WET PREP FOR TRICH, YEAST, CLUE: TRICH WET PREP: NONE SEEN

## 2015-03-14 LAB — TSH: TSH: 3.079 u[IU]/mL (ref 0.350–4.500)

## 2015-03-14 MED ORDER — NYSTATIN-TRIAMCINOLONE 100000-0.1 UNIT/GM-% EX CREA: 1.0000 "application " | TOPICAL_CREAM | Freq: Three times a day (TID) | CUTANEOUS | Status: DC

## 2015-03-14 MED ORDER — ATORVASTATIN CALCIUM 10 MG PO TABS: 10.0000 mg | ORAL_TABLET | Freq: Every day | ORAL | Status: DC

## 2015-03-14 MED ORDER — FLUCONAZOLE 150 MG PO TABS: 150.0000 mg | ORAL_TABLET | Freq: Once | ORAL | Status: DC

## 2015-03-14 MED ORDER — TINIDAZOLE 500 MG PO TABS: ORAL_TABLET | ORAL | Status: DC

## 2015-03-14 NOTE — Patient Instructions (Addendum)
Control del colesterol  Los niveles de colesterol en el organismo estn determinados significativamente por su dieta. Los niveles de colesterol tambin se relacionan con la enfermedad cardaca. El material que sigue ayuda a explicar esta relacin y a analizar qu puede hacer para mantener su corazn sano. No todo el colesterol es malo. Las lipoprotenas de baja densidad (LDL) forman el colesterol "malo". El colesterol malo puede ocasionar depsitos de grasa que se acumulan en el interior de las arterias. Las lipoprotenas de alta densidad (HDL) es el colesterol "bueno". Ayuda a remover el colesterol LDL "malo" de la sangre. El colesterol es un factor de riesgo muy importante para la enfermedad cardaca. Otros factores de riesgo son la hipertensin arterial, el hbito de fumar, el estrs, la herencia y el peso.   El msculo cardaco obtiene el suministro de sangre a travs de las arterias coronarias. Si su colesterol LDL ("malo") est elevado y el HDL ("bueno") es bajo, tiene un factor de riesgo para que se formen depsitos de grasa en las arterias coronarias (los vasos sanguneos que suministran sangre al corazn). Esto hace que haya menos lugar para que la sangre circule. Sin la suficiente sangre y oxgeno, el msculo cardaco no puede funcionar correctamente, y usted podr sentir dolores en el pecho (angina pectoris). Cuando una arteria coronaria se cierra completamente, una parte del msculo cardaco puede morir (infarto de miocardio).  CONTROL DEL COLESTEROL Cuando el profesional que lo asiste enva la sangre al laboratorio para conocer el nivel de colesterol, puede realizarle tambin un perfil completo de los lpidos. Con esta prueba, se puede determinar la cantidad total de colesterol, as como los niveles de LDL y HDL. Los triglicridos son un tipo de grasa que circula en la sangre y que tambin puede utilizarse  para determinar el riesgo de enfermedad cardaca. En la siguiente tabla se establecen los nmeros ideales: Prueba: Colesterol total  Menos de 200 mg/dl.  Prueba: LDL "colesterol malo"  Menos de 100 mg/dl.   Menos de 70 mg/dl si tiene riesgo muy elevado de sufrir un ataque cardaco o muerte cardaca sbita.  Prueba: HDL "colesterol bueno"  Mujeres: Ms de 50 mg/dl.   Hombres: Ms de 40 mg/dl.  Prueba: Trigliceridos  Menos de 150 mg/dl.    CONTROL DEL COLESTEROL CON DIETA Aunque factores como el ejercicio y el estilo de vida son importantes, la "primera lnea de ataque" es la dieta. Esto se debe a que se sabe que ciertos alimentos hacen subir el colesterol y otros lo bajan. El objetivo debe ser equilibrar los alimentos, de modo que tengan un efecto sobre el colesterol y, an ms importante, reemplazar las grasas saturadas y trans con otros tipos de grasas, como las monoinsaturadas y las poliinsaturadas y cidos grasos omega-3 . En promedio, una persona no debe consumir ms de 15 a 17 g de grasas saturadas por da. Las grasas saturadas y trans se consideran grasas "malas", ya que elevan el colesterol LDL. Las grasas saturadas se encuentran principalmente en productos animales como carne, manteca y crema. Pero esto no significa que usted debe sacrificar todas sus comidas favoritas. Actualmente, como lo muestra el cuadro que figura al final de este documento, hay sustitutos de buen sabor, bajos en grasas y en colesterol, para la mayora de los alimentos que a usted le gusta comer. Elija aquellos alimentos alternativos que sean bajos en grasas o sin grasas. Elija cortes de carne del cuarto trasero o lomo ya que estos cortes son los que tienen menor cantidad de grasa   y colesterol. El pollo (sin piel), el pescado, la carne de ternera, y la pechuga de pavo molida son excelentes opciones. Elimine las carnes grasosas como los hotdogs o el salami. Los mariscos tienen poco o nada de grasas saturadas. Cuando  consuma carne magra, carne de aves de corral, o pescado, hgalo en porciones de 85 gramos (3 onzas). Las grasas trans tambin se llaman "aceites parcialmente hidrogenados". Son aceites manipulados cientficamente de modo que son slidos a temperatura ambiente, tienen una larga vida y mejoran el sabor y la textura de los alimentos a los que se agregan. Las grasas trans se encuentran en la margarina, masitas, crackers y alimentos horneados.  Para hornear y cocinar, el aceite es un excelente sustituto para la mantequilla. Los aceites monoinsaturados tienen un beneficio particular, ya que se cree que disminuyen el colesterol LDL (colesterol malo) y elevan el HDL. Deber evitar los aceites tropicales saturados como el de coco y el de palma.  Recuerde, adems, que puede comer sin restricciones los grupos de alimentos que son naturalmente libres de grasas saturadas y grasas trans, entre los que se incluyen el pescado, las frutas (excepto el aguacate), verduras, frijoles, cereales (cebada, arroz, cuzcuz, trigo) y las pastas (sin salsas con crema)   IDENTIFIQUE LOS ALIMENTOS QUE DISMINUYEN EL COLESTEROL  Pueden disminuir el colesterol las fibras solubles que estn en las frutas, como las manzanas, en los vegetales como el brcoli, las patatas y las zanahorias; en las legumbres como frijoles, guisantes y lentejas; y en los cereales como la cebada. Los alimentos fortificados con fitosteroles tambin pueden disminuir el colesterol. Debe consumir al menos 2 g de estos alimentos a diario para obtener el efecto de disminucin de colesterol.  En el supermercado, lea las etiquetas de los envases para identificar los alimentos bajos en grasas saturadas, libres de grasas trans y bajos en grasas, . Elija quesos que tengan solo de 2 a 3 g de grasa saturada por onza (28,35 g). Use una margarina que no dae el corazn, libre de grasas trans o aceite parcialmente hidrogenado. Al comprar alimentos horneados (galletitas dulces y  galletas) evite el aceite parcialmente hidrogenado. Los panes y bollos debern ser de granos enteros (harina de maz o de avena entera, en lugar de "harina" o "harina enriquecida"). Compre sopas en lata que no sean cremosas, con bajo contenido de sal y sin grasas adicionadas.   TCNICAS DE PREPARACIN DE LOS ALIMENTOS  Nunca fra los alimentos en aceite abundante. Si debe frer, hgalo en poco aceite y removiendo constantemente, porque as se utilizan muy pocas grasas, o utilice un spray antiadherente. Cuando le sea posible, hierva, hornee o ase las carnes y cocine los vegetales al vapor. En vez de aderezar los vegetales con mantequilla o margarina, utilice limn y hierbas, pur de manzanas y canela (para las calabazas y batatas), yogurt y salsa descremados y aderezos para ensaladas bajos en contenido graso.   BAJO EN GRASAS SATURADAS / SUSTITUTOS BAJOS EN GRASA  Carnes / Grasas saturadas (g)  Evite: Bife, corte graso (3 oz/85 g) / 11 g   Elija: Bife, corte magro (3 oz/85 g) / 4 g   Evite: Hamburguesa (3 oz/85 g) / 7 g   Elija:  [place]a magra (3 oz/85 g) / 5 g   Evite: Jamn (3 oz/85 g) / 6 g   Elija:  Jamn magro (3 oz/85 g) / 2.4 g   Evite: Pollo, con piel (3 oz/85 g), Carne oscura / 4 g   Elija:  Pollo, sin piel (  3 oz/85 g), Carne oscura / 2 g   Evite: Pollo, con piel (3 oz/85 g), Carne magra / 2.5 g   Elija: Pollo, sin piel (3 oz/85 g), Carne magra / 1 g  Lcteos / Grasas saturadas (g)  Evite: Leche entera (1 taza) / 5 g   Elija: Leche con bajo contenido de grasa, 2% (1 taza) / 3 g   Elija: Leche con bajo contenido de grasa, 1% (1 taza) / 1.5 g   Elija: Leche descremada (1 taza) / 0.3 g   Evite: Queso duro (1 oz/28 g) / 6 g   Elija: Queso descremado (1 oz/28 g) / 2-3 g   Evite: Queso cottage, 4% grasa (1 taza)/ 6.5 g   Elija: Queso cottage con bajo contenido de grasa, 1% grasa (1 taza)/ 1.5 g   Evite: Helado (1 taza) / 9 g   Elija: Sorbete (1 taza) / 2.5 g   Elija: Yogurt helado sin contenido de  grasa (1 taza) / 0.3 g   Elija: Barras de fruta congeladas / vestigios   Evite: Crema batida (1 cucharada) / 3.5 g   Elija: Batidos glac sin lcteos (1 cucharada) / 1 g  Condimentos / Grasas saturadas (g)  Evite: Mayonesa (1 cucharada) / 2 g   Elija: Mayonesa con bajo contenido de grasa (1 cucharada) / 1 g   Evite: Manteca (1 cucharada) / 7 g   Elija: Margarina extra light (1 cucharada) / 1 g   Evite: Aceite de coco (1 cucharada) / 11.8 g   Elija: Aceite de oliva (1 cucharada) / 1.8 g   Elija: Aceite de maz (1 cucharada) / 1.7 g   Elija: Aceite de crtamo (1 cucharada) / 1.2 g   Elija: Aceite de girasol (1 cucharada) / 1.4 g   Elija: Aceite de soja (1 cucharada) / 2.4 g   Elija: Aceite de canola (1 cucharada) / 1 g  Document Released: 02/01/2005 Document Revised: 10/14/2010 ExitCare Patient Information 2012 [employer]. Ejercicios para perder peso (Exercise to Lose Weight) La actividad fsica y una dieta saludable ayudan a perder peso. El mdico podr sugerirle ejercicios especficos. IDEAS Y CONSEJOS PARA HACER EJERCICIOS  Elija opciones econmicas que disfrute hacer , como caminar, andar en bicicleta o los vdeos para ejercitarse.   Utilice las escaleras en lugar del ascensor.   Camine durante la hora del almuerzo.   Estacione el auto lejos del lugar de trabajo o estudio.   Concurra a un gimnasio o tome clases de gimnasia.   Comience con 5  10 minutos de actividad fsica por da. Ejercite hasta 30 minutos, 4 a 6 das por semana.   Utilice zapatos que tengan un buen soporte y ropas cmodas.   Elongue antes y despus de ejercitar.   Ejercite hasta que aumente la respiracin y el corazn palpite rpido.   Beba agua extra cuando ejercite.   No haga ejercicio hasta lastimarse, sentirse mareado o que le falte mucho el aire.  La actividad fsica puede quemar alrededor de 150 caloras.  Correr 20 cuadras en 15 minutos.   Jugar vley durante 45 a 60  minutos.   Limpiar y encerar el auto durante 45 a 60 minutos.   Jugar ftbol americano de toque.   Caminar 25 cuadras en 35 minutos.   Empujar un cochecito 20 cuadras en 30 minutos.   Jugar baloncesto durante 30 minutos.   Rastrillar hojas secas durante 30 minutos.   Andar en bicicleta 80 cuadras en 30 minutos.     Caminar 30 cuadras en 30 minutos.   Bailar durante 30 minutos.   Quitar la nieve con una pala durante 15 minutos.   Nadar vigorosamente durante 20 minutos.   Subir escaleras durante 15 minutos.   Andar en bicicleta 60 cuadras durante 15 minutos.   Arreglar el jardn entre 30 y 56 minutos.   Saltar a la soga durante 15 minutos.   Limpiar vidrios o pisos durante 45 a 60 minutos.  Document Released: 05/08/2010 Document Revised: 10/14/2010 [facility] Patient Information 2012 [place].Nystatin; Triamcinolone cream or ointment Qu es este medicamento? La NISTATINA; TRIAMCINOLONA es una combinacin de un antimictico y corticosteroide. Se utiliza para tratar ciertos tipos de infecciones micticas o por levadura de la piel. Este medicamento puede ser utilizado para otros usos; si tiene alguna pregunta consulte con su proveedor de atencin mdica o con su farmacutico. Qu le debo informar a mi profesional de la salud antes de tomar este medicamento? Necesita saber si usted presenta alguno de los siguientes problemas o situaciones: -grandes zonas de piel quemada o con lesiones -desgaste o adelgazamiento de la piel -enfermedad vascular perifrica o mala circulacin -una reaccin alrgica o inusual a la nistatina, a la triamcinolona, a otros corticosteroides, a otros medicamentos, alimentos, colorantes o conservantes -si est embarazada o buscando quedar embarazada -si est amamantando a un beb Cmo debo utilizar este medicamento? Este medicamento es slo para uso externo. No lo ingiera por va oral. Siga las instrucciones de la etiqueta del medicamento. Lvese  las manos antes y despus de usarlo. Si est tratando una infeccin de la mano o uas, slo lvese las manos antes de usarlo. Aplique una capa delgada del medicamento sobre la zona afectada y friccione suavemente. No lo utilice sobre la piel sana o sobre grandes zonas de la piel. Evite que el medicamento entre en contacto con sus ojos. Si esto ocurre, enjuguelos con abundante agua fra del grifo. Cuando lo aplique en el rea de la ingle, use una cantidad limitada y no lo utilice durante ms de 2 semanas a menos que su mdico o su profesional de la salud le indique lo contrario. No cubra ni envuelva la zona tratada con un vendaje hermtico (por ejemplo, un vendaje plstico). Complete todo el tratamiento con el medicamento recetado, aun cuando considere que la infeccin ha mejorado. Utilcelo a intervalos regulares. No utilice el medicamento con una frecuencia mayor a la indicada. No utilice este medicamento para ningn otro problema que no sea para el cual le fue recetado. Hable con su pediatra para informarse acerca del uso de este medicamento en nios. Aunque este medicamento ha sido recetado para condiciones selectivas, las precauciones se aplican. Los nios recibiendo tratamiento para una irritacin causada por paales, no deben usar paales ajustados ni calzones de plstico. Sobredosis: Pngase en contacto inmediatamente con un centro toxicolgico o una sala de urgencia si usted cree que haya tomado demasiado medicamento. ATENCIN: [employer] es solo para usted. No comparta este medicamento con nadie. Qu sucede si me olvido de una dosis? Si olvida una dosis, sela tan pronto como sea posible. Si es casi la hora de la prxima dosis, use slo esa dosis. No use dosis dobles o adicionales. Qu puede interactuar con este medicamento? No se esperan interacciones. No utilice otros productos para la piel sobre las zonas afectadas sin Teacher, adult education a su mdico o a su profesional de Technical sales engineer. Puede ser que  esta lista no menciona todas las posibles interacciones. Informe a su profesional [place]  los productos a base de hierbas, medicamentos de venta libre o suplementos nutritivos que est tomando. Si usted fuma, consume bebidas alcohlicas o si utiliza drogas ilegales, indqueselo tambin a su profesional de [employer]	[place]. Algunas sustancias pueden interactuar con su medicamento. A qu debo estar atento al usar [employer]? Si los sntomas no mejoran en 1 semana cuando est tratando el rea de la ingle o en 2 semanas cuando est tratando los pies, consulte a su mdico o a su profesional de [employer]	[place]. Informe a su mdico o a su profesional de la salud si desarrolla llagas o ampollas que no cicatrizan adecuadamente. Si la infeccin de la piel vuelve a aparecer despus de dejar de [employer], comunquese con su mdico o su profesional de [employer]	[place]. Si est utilizando este medicamento para una infeccin en el rea de la ingle, no utilice ropa interior [place] o hecha con fibras sintticas, tales como rayn o nylon. Use ropa interior holgada y de algodn. Adems, seque completamente el rea afectada despus de baarse. Qu efectos secundarios puedo tener al [employer] este medicamento? Efectos secundarios que debe informar a su mdico o a Barrister's clerk de la salud tan pronto como sea posible: -ardor o picazn de la piel -manchas rojas oscuras en la piel -prdida de la sensibilidad de la piel -formacin de ampollas llenas de pus, rojas y dolorosas en los folculos pilosos -infeccin de la piel -adelgazamiento de la piel o quemaduras de sol: ms probables si se aplica la pomada o crema en la cara Efectos secundarios que, por lo general, no requieren atencin mdica (debe informarlos a su mdico o a su profesional de la salud si persisten o si son molestos): -piel seca o descamada -irritacin de la piel Puede ser que esta lista no menciona todos los posibles efectos secundarios.  Comunquese a su mdico por asesoramiento mdico [employer]. Usted puede informar los efectos secundarios a la FDA por telfono al 1-800-FDA-1088. Dnde debo guardar mi medicina? Mantngala fuera del alcance de los nios. Gurdela a [employer], entre 15 y 64 grados C (62 y 51 grados F). No la congele. Deseche todos los medicamentos que no haya utilizado, despus de la fecha de vencimiento. ATENCIN: Este folleto es un resumen. Puede ser que no cubra toda la posible informacin. Si usted tiene preguntas acerca de esta medicina, consulte con su mdico, su farmacutico o su profesional de Technical sales engineer.    2016, Elsevier/Gold Standard. (2014-03-26 00:00:00) Tia corporal  (Body Ringworm) La tia corporal (tinea corporis) es una infeccin por hongos en la piel del cuerpo. La causa de esta infeccin no son gusanos, sino un hongo. Los hongos normalmente viven en la superficie de la piel y pueden ser tiles. Sin embargo, en el caso de la [place], los hongos crecen de [place] descontrolada y causan una infeccin en la piel. Puede afectar a cualquier zona de la piel del cuerpo y puede propagarse fcilmente de Ardelia Mems persona a otra (es contagiosa). La tia es un problema frecuente en los nios, pero tambin puede afectar a los adultos. Tambin generalmente la sufren los atletas, en especial en los luchadores que comparten equipos y colchonetas.  CAUSAS  La causa de la tia corporal es un hongo llamado dermatofito. Se puede propagar a travs de:   Contacto con [employer] infectadas.  Contacto con mascotas infectadas.  Tocar o compartir objetos que [employer] en contacto con una persona o con una mascota infectada (sombreros, peines, toallas, ropa, artculos deportivos). SNTOMAS  Picazn, manchas rojas elevadas o bultos en la piel.  Erupcin en forma de anillos.  Enrojecimiento cerca del borde de la erupcin con un centro claro.  Piel seca y escamosa dentro o alrededor de la  erupcin. No todas las personas tienen una erupcin en forma de [place]. Algunos desarrollan slo manchas rojas y escamosas.  DIAGNSTICO  Generalmente, la tia puede diagnosticarse mediante la realizacin de un examen de la piel. El mdico puede optar por realizar un raspado de la piel de la zona afectada. La muestra se examinar con un microscopio para determinar si hay hongos. TRATAMIENTO  La tia corporal puede tratarse con una crema o ungento antifngico tpico. En algunos casos, se indica un champ antihongos para el cuerpo. Podrn recetarle medicamentos antimicticos para tomar por boca si la tia es grave, si reaparece o si se prolonga por mucho tiempo.  [place] slo medicamentos de venta libre o recetados, segn las indicaciones del mdico.  Stacy Gardner el rea afectada y seque bien antes de aplicar la crema o la pomada.  Cuando use el champ antimictico para tratar la tia, deje el [employer] cuerpo durante 3 a 5 minutos antes de enjuagar.   Use ropa suelta para evitar roces e irritacin en la erupcin.  Lave o cambie sus sbanas cada noche mientras tiene la erupcin.  Si su mascota tiene la misma infeccin, hgalo tratar por un veterinario. Para prevenir la tia corporal:   Mantenga una buena higiene.  Use sandalias o zapatos en lugares pblicos y duchas.  No comparta artculos personales con [employer].  Evite tocar las manchas rojas de piel de Producer, television/film/video.  Evite tocar las [employer] tienen zonas sin pelos o lvese las manos despus de tocarlo. SOLICITE ATENCIN MDICA SI:   La erupcin contina diseminndose despus de 7 das de [place].  La erupcin no se cura en el trmino de 4 semanas.  El rea alrededor de la erupcin se vuelve roja, se hincha o duele.   Esta informacin no tiene Marine scientist el consejo del mdico. Asegrese de hacerle al mdico cualquier pregunta que tenga.   Document Released:  11/11/2004 Document Revised: 10/27/2011 Elsevier Interactive Patient Education 2016 [place] general sin causa (General Headache Without Cause) El dolor de cabeza es un dolor o Tree surgeon que se siente en la zona de la cabeza o del cuello. Puede no tener una causa especfica. Hay muchas causas y tipos de dolores de [place]. Los dolores de cabeza ms comunes son los siguientes:  Cefalea tensional.  Cefaleas migraosas.  Cefalea en brotes.  Cefaleas diarias crnicas. INSTRUCCIONES PARA EL CUIDADO EN EL HOGAR  Controle su afeccin para ver si hay cambios. Siga estos pasos para Aeronautical engineer afeccin: Control del [employer] medicamentos de venta libre y los recetados solamente como se lo haya indicado el mdico.  Cuando sienta dolor de cabeza acustese en un cuarto oscuro y tranquilo.  Si se lo indican, aplique hielo sobre la cabeza y la zona del cuello:  Ponga el hielo en una bolsa plstica.  Coloque una toalla entre la piel y la bolsa de hielo.  Coloque el hielo durante 74minutos, 2 a 3veces por Training and development officer.  Utilice una almohadilla trmica o tome una ducha con agua caliente para aplicar calor en la cabeza y la zona del cuello como se lo haya indicado el [place] luces brillantes  o sus dolores de [place]. Comida y bebida  Mantenga un horario para las comidas.  Limite el consumo de bebidas alcohlicas.  Consuma menos cantidad de cafena o deje de tomarla. Instrucciones generales  Concurra a todas las visitas de control como se lo haya indicado el mdico. Esto es importante.  Lleve un diario de los dolores de cabeza para Neurosurgeon qu factores pueden desencadenarlos. Por ejemplo, escriba los siguientes datos:  Lo que usted come y Buyer, retail.  Cunto tiempo duerme.  Algn cambio en su dieta o en los medicamentos.  Pruebe algunas tcnicas de relajacin, como los [place].  Limite el estrs.  Sintese con  la espalda recta y no tense los msculos.  No consuma productos que contengan tabaco, incluidos cigarrillos, tabaco de Higher education careers adviser o cigarrillos electrnicos. Si necesita ayuda para dejar de fumar, consulte al mdico.  Haga actividad fsica habitualmente como se lo haya indicado el mdico.  Tenga un horario fijo para dormir. Duerma entre 7 y 9horas o la cantidad de horas que le haya recomendado el mdico. SOLICITE ATENCIN MDICA SI:   Los medicamentos no Dealer los sntomas.  Tiene un dolor de cabeza que es diferente del dolor de cabeza habitual.  Tiene nuseas o vmitos.  Tiene fiebre. SOLICITE ATENCIN MDICA DE INMEDIATO SI:   El dolor se hace cada vez ms intenso.  Ha vomitado repetidas veces.  Presenta rigidez en el cuello.  Sufre prdida de la visin.  Tiene problemas para hablar.  Siente dolor en el ojo o en el odo.  Presenta debilidad muscular o prdida del control muscular.  Pierde el equilibrio o tiene problemas para Writer.  Sufre mareos o se desmaya.  Se siente confundido.   Esta informacin no tiene Marine scientist el consejo del mdico. Asegrese de hacerle al mdico cualquier pregunta que tenga.   Document Released: 11/11/2004 Document Revised: 10/23/2014 Elsevier Interactive Patient Education 2016 [employer]. Fluconazole injection Sander Nephew es este medicamento? El FLUCONAZOL es un medicamento antimictico. Se utiliza para tratar o prevenir ciertos tipos de infecciones micticas o por levadura. Este medicamento puede ser utilizado para otros usos; si tiene alguna pregunta consulte con su proveedor de atencin mdica o con su farmacutico. Qu le debo informar a mi profesional de la salud antes de tomar este medicamento? Necesita saber si usted presenta alguno de los siguientes problemas o situaciones: -antecedentes de pulso cardaco irregular -enfermedad renal -una reaccin alrgica o inusual al fluconazol, a otros medicamentos antimicticos,  alimentos, colorantes o conservantes -si est embarazada o buscando quedar embarazada -si est amamantando a un beb Cmo debo utilizar este medicamento? Este medicamento se administra mediante inyeccin por va intravenosa. Generalmente lo administra un profesional de Technical sales engineer en un hospital o en un entorno clnico. Si recibe este medicamento en su domicilio, se le ensearn cmo preparar y Biomedical engineer. selo exactamente como se le indique. Use sus dosis a intervalos regulares. No use su medicamento con una frecuencia mayor a la indicada. Es importante que deseche las agujas y las jeringas usadas en un recipiente resistente a los pinchazos. No las deseche en una basura. Si no tiene un recipiente resistente a los pinchazos, consulte a Midwife o su proveedor de atencin para obtenerlo. Hable con su pediatra para informarse acerca del uso de este medicamento en nios. Puede requerir atencin especial. Sobredosis: Pngase en contacto inmediatamente con un centro toxicolgico o una sala de urgencia si usted cree que haya tomado demasiado medicamento. ATENCIN: [employer] es solo para usted. No  comparta este medicamento con nadie. Qu sucede si me olvido de una dosis? No se aplica en este caso. Qu puede interactuar con este medicamento? No tome esta medicina con ninguno de los siguientes medicamentos: -astemizol -ciertos medicamentos para el pulso cardiaco irregular, tales como dofetilida, dronedarona, quinidina -cisapride -eritromicina -lomitapida -otros medicamentos que prolongan el intervalo QT (causa un ritmo cardiaco anormal) -pimozida -terfenadina -tioridazina -tolvaptn -ziprasidona Esta medicina tambin puede interactuar con los siguientes medicamentos: -medicamentos antivirales para el VIH o SIDA -pldoras anticonceptivas -ciertos antibiticos, tales como rifabutina, rifampicina -ciertos medicamentos para la presin sangunea, tales como amlodipina,  isradipina, felodipino, hidroclorotiazida, losartn, nifedipina -ciertos medicamentos para el cncer, tales como ciclofosfamida, vinblastina, vincristina -ciertos medicamentos para el colesterol, tales como atorvastatina, lovastatina, fluvastatina, simvastatina -ciertos medicamentos para la depresin, ansiedad o trastornos psicticos, tales como amitriptilina, midazolam, nortriptilina, triazolam -ciertos medicamentos para la diabetes, tales como glipizida, gliburida, tolbutamida -ciertos medicamentos para Conservation officer, historic buildings, tales como alfentanilo, fentanilo, metadona -ciertos medicamentos para convulsiones, tales como carbamazepina, fenitona -ciertos medicamentos que tratan o previenen cogulos sanguneos, tales como warfarina -halofantrina -medicamentos que reducen su capacidad de combatir infecciones, tales como ciclosporina, prednisona, tacrolimo -los AINE, medicamentos para el dolor o inflamacin, tales como celecoxib, diclofenaco, flurbiprofeno, ibuprofeno, meloxicam, naproxeno -otros medicamentos para infecciones micticas -sirolims -teofilina -tofacitinib Puede ser que esta lista no menciona todas las posibles interacciones. Informe a su profesional de [employer]	[place] de [employer] productos a base de hierbas, medicamentos de [place] o suplementos nutritivos que est tomando. Si usted fuma, consume bebidas alcohlicas o si utiliza drogas ilegales, indqueselo tambin a su profesional de [employer]	[place]. Algunas sustancias pueden interactuar con su medicamento. A qu debo estar atento al usar [employer]? Si los sntomas no mejoran, consulte a Adult nurse. Si toma este medicamento durante un perodo de [employer], podr Customer service manager anlisis de [place]. Pueden transcurrir [employer] o meses de tratamiento antes de que algunas infecciones micticas se curen. El alcohol puede aumentar la posibilidad de daos al hgado de [place]. Evite consumir bebidas alcohlicas. Qu efectos  secundarios puedo tener al [employer] este medicamento? Efectos secundarios que debe informar a su mdico o a Barrister's clerk de la salud tan pronto como sea posible: -Chief of Staff como erupcin cutnea, picazn o urticarias, hinchazn de los labios, boca, lengua o garganta -orina de color amarillo oscuro -sensacin de mareos o desmayos -pulso cardiaco irregular o dolor en el pecho -dolor, enrojecimiento en el lugar de la inyeccin -enrojecimiento, formacin de ampollas, descamacin o distensin de la piel, inclusive dentro de la boca -dolor estomacal -dificultad al respirar -sangrado o magulladuras inusuales -vmito -color amarillento de los ojos o la piel Efectos secundarios que, por lo general, no requieren Geophysical data processor (debe informarlos a su mdico o a Barrister's clerk de la salud si persisten o si son molestos): -cambios en el sabor de los alimentos -diarrea -dolor de cabeza -Higher education careers adviser, nuseas Puede ser que esta lista no menciona todos los posibles efectos secundarios. Comunquese a su mdico por asesoramiento mdico [employer]. Usted puede informar los efectos secundarios a la FDA por telfono al 1-800-FDA-1088. Dnde debo guardar mi medicina? Mantngala fuera del alcance de los nios. Si est Theatre manager en su domicilio recibir instrucciones sobre cmo guardar este medicamento. Deseche todo el medicamento que no haya utilizado, despus de la fecha de vencimiento. ATENCIN: Este folleto es un resumen. Puede ser que no cubra toda la posible informacin. Si usted tiene preguntas acerca de esta medicina, consulte con su mdico,  su farmacutico o su profesional de [employer]	[place].    2016, Elsevier/Gold Standard. (2014-03-26 00:00:00) Vaginitis monilisica (Monilial Vaginitis) La vaginitis es una inflamacin (irritacin, hinchazn) de la vagina y la vulva. Esta no es una enfermedad de transmisin sexual.  CAUSAS Este tipo de vaginitis lo causa  un hongo (candida) que normalmente se encuentra en la vagina. El hongo candida se ha desarrollado hasta el punto de ocasionar problemas en el equilibrio qumico. SNTOMAS  Secrecin vaginal espesa y blanca.  Hinchazn, picazn, enrojecimiento e inflamacin de la vagina y en algunos casos de los labios vaginales (vulva).  Ardor o dolor al [employer].  Dolor en [place]. DIAGNSTICO Los factores que favorecen la vaginitis moniliasica son:  Kyla Balzarine de virginidad y postmenopusicas.  Embarazo.  Infecciones.  Sentir cansancio, estar enferma o estresada, especialmente si ya ha sufrido este problema en el pasado.  Diabetes Buen control ayudar a disminur la probabilidad.  Pldoras anticonceptivas  Ropa interior [place].  El uso de espumas de bao, aerosoles femeninos duchas vaginales o tampones con desodorante.  Algunos antibiticos (medicamentos que destruyen grmenes).  Si contrae alguna enfermedad puede sufrir recurrencias espordicas. [place] profesional que lo asiste prescribir medicamentos.  Hay diferentes tipos de cremas y supositorios vaginales que tratan especficamente la vaginitis monilisica. Para infecciones por hongos recurrentes, utilice un supositorio o crema en la vagina dos veces por semana, o segn se le indique.  Tambin podrn utilizarse cremas con corticoides o anti monilisicas para la picazn o la irritacin de la vulva. Consulte con el profesional que la asiste.  Si la crema no da resultado, podr aplicarse en la vagina una solucin con azul de metileno.  El consumo de yogur puede prevenir este tipo de vaginitis. INSTRUCCIONES Powers todos los medicamentos tal como se le indic.  No mantenga relaciones sexuales hasta que el tratamiento se haya completado, o segn las indicaciones del profesional que la asiste.  Tome baos de asiento tibios.  No se aplique duchas vaginales.  No utilice tampones,  especialmente los perfumados.  Use ropa interior de algodn  [employer] pantalones ajustados y las medias tipo panty.  Comunique a sus compaeros sexuales que sufre una infeccin por hongos. Ellos deben concurrir para un control mdico si tienen sntomas como una urticaria leve o picazn.  Sus compaeros sexuales deben tratarse tambin si la infeccin es difcil de Radiographer, therapeutic.  Practique el sexo seguro - use condones  Algunos medicamentos vaginales ocasionan fallas en los condones de ltex. Los medicamentos vaginales que pueden daar los condones son:  Building services engineer cleocina  Butoconazole (Femstat)  Terconazole (Terazol) supositorios vaginales  Miconazole (Monistat) (es un medicamento de venta libre) SOLICITE ATENCIN MDICA SI:  Waldron Session tiene una temperatura oral de ms de 38,9 C (102 F).  Si la infeccin empeora luego de 2 das de tratamiento.  Si la infeccin no mejora luego de 3 das de tratamiento.  Aparecen ampollas en o alrededor de la vagina.  Si aparece una hemorragia vaginal y no es el momento del perodo.  Siente dolor al [employer].  Presenta problemas intestinales.  Tiene dolor durante las [employer].   Esta informacin no tiene Marine scientist el consejo del mdico. Asegrese de hacerle al mdico cualquier pregunta que tenga.   Document Released: 11/11/2004 Document Revised: 04/26/2011 Elsevier Interactive Patient Education [employer].

## 2015-03-14 NOTE — Progress Notes (Signed)
Dawn Villegas 1965/02/22 [id number]   History:    50 y.o.  for annual gyn exam with several complaints today as follows: #1 patient concern about any annular red area on her left shoulder blade #2 patient complaining of a vaginal discharge #3 patient complaining of weekly headaches #4 patient complaining of pressure on the bottom of her feet after standing all day. #5 patient complaining of weight gain #6 patient complaining of tiredness and fatigue #7 patient with past history vitamin D deficiency #8 patient with past history of fibroid uterus #9 history of hypothyroidism  Patient with no past history of abnormal Pap smears  Past medical history,surgical history, family history and social history were all reviewed and documented in the EPIC chart.  Gynecologic History Patient's last menstrual period was 02/25/2015. Contraception: IUD Last Pap: 2014. Results were: normal Last mammogram: 2016. Results were: normal  Obstetric History OB History  Gravida Para Term Preterm AB SAB TAB Ectopic Multiple Living  5 4   1 1    4     # Outcome Date GA Lbr Len/2nd Weight Sex Delivery Anes PTL Lv  5 SAB           4 Para           3 Para           2 Para           1 Para                ROS: A ROS was performed and pertinent positives and negatives are included in the history.  GENERAL: No fevers or chills. HEENT: No change in vision, no earache, sore throat or sinus congestion. NECK: No pain or stiffness. CARDIOVASCULAR: No chest pain or pressure. No palpitations. PULMONARY: No shortness of breath, cough or wheeze. GASTROINTESTINAL: No abdominal pain, nausea, vomiting or diarrhea, melena or bright red blood per rectum. GENITOURINARY: No urinary frequency, urgency, hesitancy or dysuria. MUSCULOSKELETAL: No joint or muscle pain, no back pain, no recent trauma. DERMATOLOGIC: No rash, no itching, no lesions. ENDOCRINE: No polyuria, polydipsia, no heat or cold intolerance. No recent change  in weight. HEMATOLOGICAL: No anemia or easy bruising or bleeding. NEUROLOGIC: No headache, seizures, numbness, tingling or weakness. PSYCHIATRIC: No depression, no loss of interest in normal activity or change in sleep pattern.     Exam: chaperone present  BP 124/80 mmHg  Ht 5\' 1"  (1.549 m)  Wt 164 lb (74.39 kg)  BMI 31.00 kg/m2  LMP 02/25/2015  Body mass index is 31 kg/(m^2).  General appearance : Well developed well nourished female. No acute distress HEENT: Eyes: no retinal hemorrhage or exudates,  Neck supple, trachea midline, no carotid bruits, no thyroidmegaly Lungs: Clear to auscultation, no rhonchi or wheezes, or rib retractions  Heart: Regular rate and rhythm, no murmurs or gallops Breast:Examined in sitting and supine position were symmetrical in appearance, no palpable masses or tenderness,  no skin retraction, no nipple inversion, no nipple discharge, no skin discoloration, no axillary or supraclavicular lymphadenopathy Abdomen: no palpable masses or tenderness, no rebound or guarding Extremities: no edema or skin discoloration or tenderness, red annular skin lesion left shoulder blade consistent with ringworm  Pelvic:  Bartholin, Urethra, Skene Glands: Within normal limits             Vagina: No gross lesions or discharge  Cervix: No gross lesions or discharge, IUD string seen  Uterus  anteverted, normal size, shape and consistency, non-tender and mobile  Adnexa  Without masses or tenderness  Anus and perineum  normal   Rectovaginal  normal sphincter tone without palpated masses or tenderness             Hemoccult not indicated   Wet prep: Many yeast, few clue cells, too numerous to count WBC, too numerous to count bacteria  Assessment/Plan:  50 y.o. female for annual exam patient is fasting today and the following screening blood work was ordered: Comprehensive metabolic panel, fasting lipid profile, TSH, CBC, and urinalysis. Pap smear done today without HPV as per  guidelines. For patient's ringworm on her left shoulder blade she will be prescribed Mycolog cream to apply 3 times a day for 7-10 days. For her yeast vaginitis she'll be prescribed Diflucan 150 mg 1 by mouth today. She was asked to hold off her Lipitor for the next 3 days. Also for 2 days consecutively she will take 4 tablets of Tindamax 500 mg for a total A tablets over course of 2 days for bacterial vaginosis. Patient is going to be referred to the nutritionist to assist with diet guidance. Discussed importance of exercise 3-4 times a week. She's going to repeat referred to the podiatrist she might need orthotics. Also I've asked her to take her Lipitor 10 mg every other day and if her headaches continue knife given her the name of the neurologist for her to follow-up with. Because of her past history vitamin D deficiency we'll be checking a vitamin D level today as well.   Terrance Mass MD, 4:34 PM 03/14/2015

## 2015-03-15 LAB — URINALYSIS W MICROSCOPIC + REFLEX CULTURE
BACTERIA UA: NONE SEEN [HPF]
BILIRUBIN URINE: NEGATIVE
Casts: NONE SEEN [LPF]
Crystals: NONE SEEN [HPF]
GLUCOSE, UA: NEGATIVE
HGB URINE DIPSTICK: NEGATIVE
Ketones, ur: NEGATIVE
NITRITE: NEGATIVE
PH: 6 (ref 5.0–8.0)
PROTEIN: NEGATIVE
RBC / HPF: NONE SEEN RBC/HPF (ref <=2)
Specific Gravity, Urine: 1.016 (ref 1.001–1.035)
YEAST: NONE SEEN [HPF]

## 2015-03-16 LAB — VITAMIN D 25 HYDROXY (VIT D DEFICIENCY, FRACTURES): VIT D 25 HYDROXY: 34 ng/mL (ref 30–100)

## 2015-03-17 ENCOUNTER — Other Ambulatory Visit: Payer: Self-pay | Admitting: Gynecology

## 2015-03-17 ENCOUNTER — Telehealth: Payer: Self-pay | Admitting: *Deleted

## 2015-03-17 DIAGNOSIS — R635 Abnormal weight gain: Secondary | ICD-10-CM

## 2015-03-17 LAB — URINE CULTURE: Colony Count: 50000

## 2015-03-17 MED ORDER — NITROFURANTOIN MONOHYD MACRO 100 MG PO CAPS: 100.0000 mg | ORAL_CAPSULE | Freq: Two times a day (BID) | ORAL | Status: DC

## 2015-03-17 MED ORDER — CEFUROXIME AXETIL 250 MG PO TABS: 250.0000 mg | ORAL_TABLET | Freq: Two times a day (BID) | ORAL | Status: DC

## 2015-03-17 NOTE — Telephone Encounter (Signed)
Referral placed for nutritionist they will contact pt to schedule

## 2015-03-17 NOTE — Telephone Encounter (Signed)
-----   Message from Ramond Craver, [place] sent at 03/14/2015  2:38 PM EST -----   ----- Message -----    From: Terrance Mass, MD    Sent: 03/14/2015   2:34 PM      To: Ramond Craver, RMA  Please appointment with nutritionist for this patient who needs guidance with Diet due to weight gain issues

## 2015-03-18 LAB — CYTOLOGY - PAP

## 2015-03-26 NOTE — Telephone Encounter (Signed)
Appointment on 04/11/15

## 2015-04-06 ENCOUNTER — Other Ambulatory Visit: Payer: Self-pay | Admitting: Gynecology

## 2015-04-11 ENCOUNTER — Ambulatory Visit (INDEPENDENT_AMBULATORY_CARE_PROVIDER_SITE_OTHER): Payer: 59

## 2015-04-11 ENCOUNTER — Encounter: Payer: Self-pay | Admitting: Dietician

## 2015-04-11 ENCOUNTER — Ambulatory Visit (INDEPENDENT_AMBULATORY_CARE_PROVIDER_SITE_OTHER): Payer: 59 | Admitting: Podiatry

## 2015-04-11 ENCOUNTER — Encounter: Payer: 59 | Attending: Gynecology | Admitting: Dietician

## 2015-04-11 VITALS — Ht 61.0 in | Wt 153.0 lb

## 2015-04-11 DIAGNOSIS — R635 Abnormal weight gain: Secondary | ICD-10-CM | POA: Diagnosis not present

## 2015-04-11 DIAGNOSIS — M79672 Pain in left foot: Secondary | ICD-10-CM

## 2015-04-11 DIAGNOSIS — M722 Plantar fascial fibromatosis: Secondary | ICD-10-CM

## 2015-04-11 DIAGNOSIS — M79671 Pain in right foot: Secondary | ICD-10-CM

## 2015-04-11 MED ORDER — DICLOFENAC SODIUM 75 MG PO TBEC: 75.0000 mg | DELAYED_RELEASE_TABLET | Freq: Two times a day (BID) | ORAL | Status: DC

## 2015-04-11 MED ORDER — TRIAMCINOLONE ACETONIDE 10 MG/ML IJ SUSP
10.0000 mg | Freq: Once | INTRAMUSCULAR | Status: AC
  Administered 2015-04-11: 10 mg

## 2015-04-11 NOTE — Patient Instructions (Addendum)
Fascitis plantar con rehabilitación  (Plantar Fasciitis With Rehab)  La fascia plantar es una estructura fibrosa, tipo ligamento, de tejido blando que abarca la parte inferior del pie. La fascitis plantar es una enfermedad que ocasiona dolor en el pie debido a la inflamación del tejido.  SÍNTOMAS  · Dolor y sensibilidad en la planta del pie.  · Dolor especialmente al ponerse de pie o caminar.  CAUSAS  La fascitis plantar está causada por irritación y lesión en la fascia plantar debajo del pie. Los mecanismos más frecuentes de una lesión son:  · Golpe directo en la planta del pie.  · Daño a un pequeño nervio que atraviesa el pie, en el lugar en que la fascia principal se une al hueso del talón.  · Estrés aplicado en la fascia plantar debido a espolones óseos.  EL RIESGO AUMENTA CON:   · Actividades que estresan la fascia plantar (correr, saltar, pivotar o cortar).  · Poca fuerza y flexibilidad.  · Calzado mal ajustado.  · Músculos de la pantorrilla tensos.  · Pie plano.  · No hacer un precalentamiento adecuado.  · Obesidad.  PREVENCIÓN  · Precalentamiento adecuado y elongación antes de la actividad.  · Descanso y recuperación entre actividades.  · Mantener la forma física:    Fuerza, flexibilidad y resistencia muscular.    Capacidad cardiovascular.  · Mantenga un peso corporal adecuado.  · Evite el estrés en la fascia plantar.  · Para deportistas con pie plano, utilización de plantillas anatómicas para los arcos.  PRONÓSTICO  Si se trata adecuadamente, generalmente es curable sin cirugía. En ocasiones requiere someterse a una cirugía.  POSIBLES COMPLICACIONES  · La recurrencia frecuente de los síntomas puede dar como resultado un problema crónico.  · Problemas en la cintura causados para compensar la lesión, como renguera.  · Dolor o debilidad en el pie al adelantar el pie luego de la cirugía.  · Inflamación crónica, cicatrización y ruptura parcial o completa de la fascia, que se produce luego de repetidas  inyecciones.  TRATAMIENTO  El tratamiento inicial incluye el uso de medicamentos y la aplicación de hielo para reducir el dolor y la inflamación. Los ejercicios de elongación y fortalecimiento pueden ayudar a reducir el dolor con la actividad, en especial los dirigidos al tendón de [place]. Los ejercicios pueden realizarse en el hogar o con un terapeuta. El médico le podrá recomendar que utilice tacos o soportes para el arco para ayudar a reducir el estrés de la fascia plantar. En algunos casos se indica una inyección de corticoides para reducir la inflamación. Si los síntomas persisten por más de 6 meses de tratamiento no quirúrgico (conservador), se indicará la cirugía.   MEDICAMENTOS  · Si necesita analgésicos, se recomiendan los antiinflamatorios no esteroides, como aspirina e ibuprofeno y otros calmantes menores, como acetaminofeno  · No tome medicamentos para el dolor dentro de los 7 días previos a la cirugía.  · Los analgésicos prescriptos se indicarán si el médico lo considera necesario. Utilícelos como se le indique y sólo cuando lo necesite.  · En algunos casos se indica una inyección de corticosteroides. Estas inyecciones deben reservarse para los casos graves, porque sólo se pueden administrar una determinada cantidad de veces.  CALOR Y FRÍO  · El tratamiento con frío alivia el dolor y reduce la inflamación. El frío debe aplicarse durante 10 a 15 minutos cada 2 ó 3 horas para reducir la inflamación y el dolor e inmediatamente después de cualquier actividad que agrava los síntomas.   Utilice bolsas de hielo o masajee la zona con un trozo de hielo (masaje de hielo).  · El calor puede usarse antes de elongar y de las actividades de fortalecimiento indicadas por el profesional, le fisioterapeuta o el entrenador. Utilice una bolsa térmica o sumerja la lesión en agua caliente.  SOLICITE ATENCIÓN MÉDICA DE INMEDIATO SI:  · El tratamiento no lo beneficia, o el trastorno empeora.  · Los medicamentos producen  efectos secundarios.  EJERCICIOS  EJERCICIOS DE AMPLITUD DE MOVIMIENTOS Y ELONGACIÓN - Fascitis plantar (síndrome del espolón en el talón)  Estos ejercicios le ayudarán en la recuperación de la lesión. Los síntomas podrán aliviarse con o sin asistencia adicional de su médico, fisioterapeuta o entrenador. Al completar estos ejercicios, recuerde:   · Restaurar la flexibilidad del tejido ayuda a que las articulaciones recuperen el movimiento normal. Esto permite que el movimiento y la actividad sea más saludables y menos dolorosos.  · Para que sea efectiva, cada elongación debe realizarse durante al menos 30 segundos.  · La elongación nunca debe ser dolorosa. Deberá sentir sólo un alargamiento o distensión suave del tejido que estira.  AMPLITUD DE MOVIMIENTOS - Extensión de los dedos - flexión  · Siéntese con la pierna derecha / izquierdo cruzada sobre la rodilla opuesta.  · Tome los dedos de los pies y empújelos hacia usted. Debe sentir un estiramiento suave en la zona inferior de los dedos y del pie.  · Mantenga esta posición durante __________ segundos.  · Luego, tome los dedos de los pies y empújelos hacia abajo. Debe sentir un estiramiento suave en la zona superior de los dedos y del pie.  · Mantenga esta posición durante __________ segundos.  Repítalo __________ veces. Realice este estiramiento __________ veces por día.   AMPLITUD DE MOVIMIENTOS - Dorsiflexión del tobillo - activa, asistida  · Quítese los zapatos y siéntese en una silla, preferiblemente en una superficie sin alfombra.  · Coloque el pie derecha / izquierdo debajo de la rodilla. Extienda la pierna contraria para estar apoyado.  · Con el talón hacia abajo, deslice el pie derecha / izquierdo hacia la silla hasta que sienta un estiramiento en el tobillo o pantorrilla. Si no lo siente, deslice la cadera hacia adelante hasta el borde de la silla, manteniendo el talón hacia abajo.  · Mantenga esta posición durante __________ segundos.  Repítalo  __________ veces. Realice este estiramiento __________ veces por día.   ELONGACIÓN - Gastroc De pie  · Coloque las manos en la pared.  · Extienda la pierna derecha / izquierdo y mantenga la rodilla levemente flexionada.  · Apunte los dedos ligeramente hacia adentro con el pie de atrás.  · Mantenga el talón derecha / izquierdo en el suelo y la rodilla recta, cambie el peso hacia la pared y no permita que la espalda se arquee.  · Debe sentir un estiramiento en la pantorrila derecha / izquierdo. Mantenga esta posicición durante __________ segundos.  Repítalo __________ veces. Realice este estiramiento __________ veces por día.  ELONGACIÓN - Sóleo De pie  · Coloque las manos en la pared.  · Extienda la pierna derecha / izquierdo y mantenga la rodilla levemente flexionada.  · Apunte los dedos ligeramente hacia adentro con el pie de atrás.  · Mantenga el talón derecha / izquierdo en el suelo, doble la rodilla de atrás y cambie suavemente el peso sobre la pierna de atrás hasta que sienta un ligero estiramiento en la pantorrilla de atrás.  · Mantenga esta posicición durante __________ segundos.    Repítalo __________ veces. Realice este estiramiento __________ veces por día.  ELONGACIÓN - Gastrocsoleus De pie  Nota: Este ejercicio puede ser muy estresante para el pie y el tobillo. Realice los ejercicios sólo en la forma indicada por el profesional que lo asiste.   · Coloque la región metatarsiana del pie derecha / izquierdo y el otro en el mismo escalón.  · Si es necesario, sosténgase de la pared o de la barandilla de una escalera para mantener el equilibrio.  · Levante lentamente el otro pie, y permita que el peso del cuerpo presione el talón sobre el borde del escalón.  · Debe sentir un estiramiento en la pantorrilla derecha / izquierdo.  · Mantenga esta posicición durante __________ segundos.  · Repita este ejercicio con la rodilla derecha / izquierdo levemente flexionada.  Repítalo __________ veces. Realice este  estiramiento __________ veces por día.   EJERCICIOS DE FORTALECIMIENTO - Fascitis plantar (síndrome del espolón en el talón)  Estos ejercicios le ayudarán en la recuperación de la lesión. Los síntomas podrán desaparecer con o sin mayor intervención del profesional, el fisioterapeuta o el entrenador. Al completar estos ejercicios, recuerde:   · Los músculos pueden ganar tanto la resistencia como la fortaleza que necesita para sus actividades diarias a través de ejercicios controlados.  · Realice los ejercicios como se lo indicó el médico, el fisioterapeuta o el entrenador. Aumente la resistencia y las repeticiones según se le haya indicado.  FUERZA - Rollo de toalla  · Siéntese en una silla en una superficie no alfombrada.  · Coloque el pie en una toalla, y mantenga el talón en el suelo.  · Coloque la toalla alrededor del talón pero envuelva sólo los dedos del pie. Mantenga el talón contra el piso.  · Añada ____________________ al extremo de la toalla si el médico, fisioterapeuta o entrenador se lo indican.  Repítalo __________ veces. Realice este ejercicio __________ veces por día.  FUERZA - Inversión del tobillo  · Asegure un extremo de una banda de goma para ejercicios a un objeto fijo (mesa, columna). Ate el extremo opuesto a su pie, justo antes de los dedos.  · Coloque los puños entre las rodillas. Esto hará que la fuerza se concentre en el tobillo.  · Lentamente, tire del dedo gordo hacia arriba y hacia adentro y asegúrese de que la banda está posicionada de tal forma que puede resistir el movimiento completo.  · Mantenga esta posicición durante __________ segundos.  · Haga que los músculos resistan la banda mientras tira lentamente el pie hacia atrás hasta la posición inicial.  Repítalo __________ veces. Realice este ejercicio __________ veces por día.      Esta información no tiene como fin reemplazar el consejo del médico. Asegúrese de hacerle al médico cualquier pregunta que tenga.     Document Released:  11/18/2005 Document Revised: 06/18/2014  Elsevier Interactive Patient Education ©2016 Elsevier Inc.

## 2015-04-11 NOTE — Progress Notes (Signed)
   Subjective:    Patient ID: Dawn Villegas, female    DOB: 11-24-1965, 50 y.o.   MRN: [id number]  HPI  PT STATED B/L BOTTOM HEELS BEEN PAINFUL FOR ABOUT 4 MONTHS. B/L FOOT ARE GETTING WORSE ESPECIALLY WHEN WALKING OR END OF THE DAY. TRIED NASIA-NO RELIEF.  Review of Systems  Constitutional: Positive for fatigue and unexpected weight change.  HENT: Positive for sinus pressure.   Respiratory: Positive for shortness of breath.   All other systems reviewed and are negative.      Objective:   Physical Exam        Assessment & Plan:

## 2015-04-11 NOTE — Progress Notes (Signed)
  Medical Nutrition Therapy:  Appt start time: [id number] end time:  [id number].   Assessment:  Primary concerns today: Patient is here today with her husband.  They require an interpretor.  Interpretor from [place], [place] assisted with this consult.  Referral for increased weight gain.  Hx includes hypothyroidism and vitamin D deficiency.  She has changed her diet in the last 2 weeks by increasing her intake of vegetables and water and decreasing her intake of sweets, tortillas, cookies, bread, and soda.  She has lost from her highest weight of 164 lbs 2 weeks ago to 153 lbs today.  Her lowest weight ws 120 lbs at 50 yo.  She has complained of bloating but this has improved with dietary changes.  Patient reports very large portions prior to changes.  Patient lives with her husband and works for [employer] orders.    Preferred Learning Style:   No preference indicated   Learning Readiness:   Ready  Change in progress  MEDICATIONS: see list   DIETARY INTAKE:  Usual eating pattern includes 2-3 meals and 1 snacks per day.  24-hr recall:  B ( AM): Green shake: celery, cucumber, cauliflower, spinach, pineapple, apple, and water.  Snk ( AM): toast, 2% milk, small amount coffee with sugar and milk, occasional peach tea  L (12:15 PM): Yogurt and papaya or other fruit Snk (3PM): Stewed chicken and vegetables D ( PM): Skips if she gets home at 7:00 and she complains of hunger Snk ( PM):  Beverages: water, small amount coffee with sugar and 2% milk, occasional peach tea  Usual physical activity: on her feet all day at work and has no more energy as well as complaints of her feet hurting her.  Estimated energy needs: 1400 calories 158 g carbohydrates 88 g protein 47 g fat  Progress Towards Goal(s):  In progress.   Nutritional Diagnosis:  NB-1.1 Food and nutrition-related knowledge deficit As related to balance of intake and energy expenditure for weight control.  As evidenced by  patient report and diet hx.    Intervention:  Nutrition counseling/education related to healthy eating and weight loss.  Plan: Increase intake of non starchy vegetables. Decrease portion size of meat. Continue changes that you have made  Teaching Method Utilized:  Visual Auditory Hands on  Handouts given during visit include:  My plate in spanish  Barriers to learning/adherence to lifestyle change: Language barrier   Demonstrated degree of understanding via:  Teach Back   Monitoring/Evaluation:  Dietary intake, exercise, and body weight prn.

## 2015-04-13 NOTE — Progress Notes (Signed)
Subjective:     Patient ID: Dawn Villegas, female   DOB: 1965/09/16, 50 y.o.   MRN: [id number]  HPI patient presents with interpreter for heel pain that's been going on for about 4-6 months. Getting worse over the last several months   Review of Systems  All other systems reviewed and are negative.      Objective:   Physical Exam  Constitutional: She is oriented to person, place, and time.  Cardiovascular: Intact distal pulses.   Musculoskeletal: Normal range of motion.  Neurological: She is oriented to person, place, and time.  Skin: Skin is warm.  Nursing note and vitals reviewed.  neurovascular status intact muscle strength adequate range of motion within normal limits with exquisite discomfort plantar heel region bilateral left over right with inflammation fluid around the medial band and I also noted patient to have moderate depression of the arch upon weightbearing     Assessment:     Inflammatory plantar fasciitis bilateral    Plan:     H&P and x-rays of both feet reviewed. Injected the plantar fascia bilateral 3 mg Kenalog 5 mill grams Xylocaine and applied fascial brace bilateral and placed on diclofenac 75 mg twice a day. Reappoint to recheck in 2 weeks and may require orthotic therapy  X-ray report indicates small spur formation with no indication stress fracture arthritis

## 2015-04-28 ENCOUNTER — Encounter: Payer: Self-pay | Admitting: Podiatry

## 2015-04-28 ENCOUNTER — Ambulatory Visit (INDEPENDENT_AMBULATORY_CARE_PROVIDER_SITE_OTHER): Payer: 59 | Admitting: Podiatry

## 2015-04-28 DIAGNOSIS — M79672 Pain in left foot: Secondary | ICD-10-CM

## 2015-04-28 DIAGNOSIS — M722 Plantar fascial fibromatosis: Secondary | ICD-10-CM

## 2015-04-28 DIAGNOSIS — M79671 Pain in right foot: Secondary | ICD-10-CM | POA: Diagnosis not present

## 2015-04-28 MED ORDER — TRIAMCINOLONE ACETONIDE 10 MG/ML IJ SUSP
10.0000 mg | Freq: Once | INTRAMUSCULAR | Status: AC
  Administered 2015-04-28: 10 mg

## 2015-04-28 NOTE — Progress Notes (Signed)
Subjective:     Patient ID: Dawn Villegas, female   DOB: Jul 11, 1965, 51 y.o.   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 Reappoint when orthotics are returned

## 2015-04-28 NOTE — Patient Instructions (Addendum)
Fascitis plantar (Plantar Fasciitis) La fascitis plantar es una afeccin dolorosa que se produce en el taln. Ocurre cuando la banda de tejido que conecta los dedos con el hueso del taln (fascia plantar) se irrita. Esto puede ocurrir despus de hacer mucho ejercicio u otras actividades repetitivas (lesin por uso excesivo). El dolor de la fascitis plantar puede ser de leve (irritacin) a intenso, y en los casos ms agudos puede dificultar que la persona camine o se mueva. Por lo general, el dolor es peor a la maana o despus de permanecer sentado o acostado durante un perodo. CAUSAS Este trastorno puede ser causado por:  Estar de pie durante largos perodos.  Usar zapatos que no calcen bien.  Practicar actividades de alto impacto, como correr, o hacer ejercicios aerbicos o ballet.  Tener sobrepeso.  Tener una forma de caminar (marcha) anormal.  Tener los msculos de la pantorrilla tensos.  Tener el arco alto en los pies.  Comenzar una nueva actividad fsica. SNTOMAS El sntoma principal de esta afeccin es el dolor en el taln. Otros sntomas pueden ser los siguientes:  Dolor que empeora despus de una actividad o un ejercicio.  Dolor ms intenso a la maana o despus de descansar.  Dolor que desaparece despus de caminar durante unos minutos. DIAGNSTICO Esta afeccin se puede diagnosticar en funcin de los signos y los sntomas. El mdico tambin le realizar un examen fsico para controlar si tiene lo siguiente:  Un rea dolorida en la parte inferior del pie.  El arco alto.  Dolor al mover el pie.  Dificultad para mover el pie. Tambin puede necesitar estudios por imgenes para confirmar el diagnstico. Estos pueden incluir los siguientes:  Radiografas.  Ecografa.  Resonancia magntica. TRATAMIENTO  El tratamiento de la fascitis plantar depende de la gravedad de la afeccin. El tratamiento puede incluir lo siguiente:  Reposo, hielo y analgsicos de venta  libre para controlar el dolor.  Ejercicios para estirar las pantorrillas y la fascia plantar.  Una frula que mantiene el pie estirado y hacia arriba mientras usted duerme (frula nocturna).  Fisioterapia para aliviar los sntomas y evitar problemas en el futuro.  Inyecciones de cortisona para aliviar el dolor intenso.  Tratamiento con ondas de choque extracorpreas para estimular con impulsos elctricos la fascia plantar lesionada. Esto suele usarse como un ltimo recurso antes de la ciruga.  Ciruga, si los otros tratamientos no han funcionado despus de 12meses. INSTRUCCIONES PARA EL CUIDADO EN EL HOGAR  Tome los medicamentos solamente como se lo haya indicado el mdico.  Evite las actividades que causan dolor.  Frote la parte inferior del pie sobre una bolsa de hielo o una botella de agua fra. Haga esto durante 20minutos, de 3a 4veces al da.  Realice estiramientos simples como se lo haya indicado el mdico.  Trate de usar calzado deportivo con amortiguacin de aire o gel, o plantillas blandas.  Si el mdico se lo ha indicado, use una frula nocturna para dormir.  Cumpla con todas las visitas de control, segn le indique su mdico. PREVENCIN   No realice ejercicios ni actividades que le causen dolor en el taln.  Considere la posibilidad de empezar actividades de bajo impacto si sigue teniendo problemas.  Pierda peso si lo necesita. La mejor forma de prevenir la fascitis plantar es evitar las actividades que lesionan ms la fascia plantar. SOLICITE ATENCIN MDICA SI:  Los sntomas no desaparecen despus del tratamiento en su casa.  El dolor empeora.  El dolor afecta la capacidad de   moverse o de realizar las actividades diarias.   Esta informacin no tiene como fin reemplazar el consejo del mdico. Asegrese de hacerle al mdico cualquier pregunta que tenga.   Document Released: 11/11/2004 Document Revised: 06/18/2014 Elsevier Interactive Patient Education  2016 Elsevier Inc.  

## 2015-05-20 ENCOUNTER — Ambulatory Visit: Payer: 59 | Admitting: *Deleted

## 2015-05-20 DIAGNOSIS — M722 Plantar fascial fibromatosis: Secondary | ICD-10-CM

## 2015-05-20 NOTE — Patient Instructions (Signed)

## 2015-05-20 NOTE — Progress Notes (Signed)
Patient ID: Dawn Villegas, female   DOB: [birth year], 50 y.o.   MRN: [id number] Patient presents for orthotic pick up.  Verbal and written break in and wear instructions given.  Patient will follow up in 4 weeks if symptoms worsen or fail to improve.

## 2015-06-02 ENCOUNTER — Encounter: Payer: Self-pay | Admitting: Gynecology

## 2015-06-02 ENCOUNTER — Ambulatory Visit (INDEPENDENT_AMBULATORY_CARE_PROVIDER_SITE_OTHER): Payer: 59 | Admitting: Gynecology

## 2015-06-02 VITALS — BP 106/78

## 2015-06-02 DIAGNOSIS — N760 Acute vaginitis: Secondary | ICD-10-CM

## 2015-06-02 DIAGNOSIS — A499 Bacterial infection, unspecified: Secondary | ICD-10-CM

## 2015-06-02 DIAGNOSIS — R21 Rash and other nonspecific skin eruption: Secondary | ICD-10-CM | POA: Diagnosis not present

## 2015-06-02 DIAGNOSIS — B9689 Other specified bacterial agents as the cause of diseases classified elsewhere: Secondary | ICD-10-CM

## 2015-06-02 DIAGNOSIS — R300 Dysuria: Secondary | ICD-10-CM

## 2015-06-02 DIAGNOSIS — N898 Other specified noninflammatory disorders of vagina: Secondary | ICD-10-CM

## 2015-06-02 LAB — WET PREP FOR TRICH, YEAST, CLUE
TRICH WET PREP: NONE SEEN
Yeast Wet Prep HPF POC: NONE SEEN

## 2015-06-02 LAB — URINALYSIS W MICROSCOPIC + REFLEX CULTURE
Bilirubin Urine: NEGATIVE
Casts: NONE SEEN [LPF]
Crystals: NONE SEEN [HPF]
Glucose, UA: NEGATIVE
Hgb urine dipstick: NEGATIVE
KETONES UR: NEGATIVE
LEUKOCYTES UA: NEGATIVE
NITRITE: NEGATIVE
PH: 5 (ref 5.0–8.0)
Protein, ur: NEGATIVE
SPECIFIC GRAVITY, URINE: 1.01 (ref 1.001–1.035)
YEAST: NONE SEEN [HPF]

## 2015-06-02 MED ORDER — NYSTATIN-TRIAMCINOLONE 100000-0.1 UNIT/GM-% EX CREA: 1.0000 "application " | TOPICAL_CREAM | Freq: Three times a day (TID) | CUTANEOUS | Status: DC

## 2015-06-02 MED ORDER — TINIDAZOLE 500 MG PO TABS: ORAL_TABLET | ORAL | Status: DC

## 2015-06-02 NOTE — Progress Notes (Signed)
   Patient is a 50 year old who presented the office today with several complaints: #1 several days ago she began expressing frequency and dysuria and she brought over-the-counter Azo as an anti-stress medication and Monistat cream which she used for 3 days. She denied any nausea, vomiting, fever, chills or back pain. #2 patient was complaining of a slight vaginal discharge with odor #3 patient complaining of a rash like area on her left shoulder near her neck  Patient's currently using Mirena IUD for contraception she was seen in January for her annual exam see previous note for additional details.  Exam: Back: No CVA tenderness Abdomen: No suprapubic tenderness Pelvic exam: Bartholin urethra Skene glands within normal limits Vagina: No blood present small amount of clear discharge noted Cervix: No lesions or discharge Bimanual exam not done  Wet prep: Few clue cells were noted rare WBC and many bacteria  Urinalysis: 0-5 WBC, 0-2 RBC and few bacteria culture pending  Left shoulder neck area skin rash probably from heat and hair will be prescribed Mycolog cream once again which has helped in the past.  Assessment/plan: #1 bacterial vaginosis will be treated with Tindamax 500 mg 4 tablets today and for talus tomorrow #2 few bacteria and urine culture pending at this time #3 skin rash probably attributed to heat and hair over this area has responded to Mycolog cream prescription given to apply 3 times a day for the first week followed by twice a day for the second week and then once daily for the third week and then once weekly as needed

## 2015-06-02 NOTE — Patient Instructions (Signed)
Erupcin cutnea (Rash)  Una erupcin es un cambio en el color o en la forma en que siente su piel. Hay diferentes tipos de erupcin. Puede ser que tenga otros sntomas adems de la erupcin.  CUIDADOS EN EL HOGAR  Evite lo que ha causado la erupcin.  No se rasque la lesin.  Puede tomar baos con agua fresca para detener la picazn.  Tome slo los medicamentos que le haya indicado el mdico.  Cumpla con los controles mdicos segn las indicaciones. SOLICITE AYUDA DE INMEDIATO SI:  El dolor, la inflamacin (hinchazn) o el enrojecimiento empeoran.  Tiene fiebre.  Tiene sntomas nuevos o estos empeoran.  Siente dolores en el cuerpo, tiene heces acuosas (diarrea) o vmitos.  La erupcin no mejora en el trmino de 3 das. ASEGRESE QUE:   Comprende estas instrucciones.  Controlar su enfermedad.  Solicitar ayuda inmediatamente si no mejora o si empeora.   Esta informacin no tiene Marine scientist el consejo del mdico. Asegrese de hacerle al mdico cualquier pregunta que tenga.   Document Released: 04/30/2008 Document Revised: 10/27/2011 Elsevier Interactive Patient Education 2016 [employer]. Tinidazole tablets Qu es este medicamento? El TINIDAZOL es un medicamento antiinfeccioso. Se utiliza para tratar la amebiasis, giardiasis, tricomonosis y vaginosis. No es efectivo para resfros, gripe u otras infecciones de origen viral. Este medicamento puede ser utilizado para otros usos; si tiene alguna pregunta consulte con su proveedor de atencin mdica o con su farmacutico. Qu le debo informar a mi profesional de la salud antes de tomar este medicamento? Necesita saber si usted presenta alguno de los siguientes problemas o situaciones: -anemia u otros trastornos sanguneos -si consume bebidas alcohlicas con frecuencia -recibe hemodilisis -trastorno de convulsiones -una reaccin alrgica o inusual al tinidazol, a otros medicamentos, alimentos, colorantes o  conservantes -si est embarazada o buscando quedar embarazada -si est amamantando a un beb Cmo debo utilizar este medicamento? Tome este medicamento por va oral con un vaso lleno de agua. Siga las instrucciones de la etiqueta del [place]. Tomar con alimentos. Tome sus dosis a intervalos regulares. No tome su medicamento con una frecuencia mayor a la indicada. Complete todas las dosis de su medicamento como se le haya indicado aun si se siente mejor. No omita ninguna dosis o suspenda el uso de su medicamento antes de lo indicado. Hable con su pediatra para informarse acerca del uso de este medicamento en nios. Aunque este medicamento ha sido recetado a nios tan menores como de 3 aos de edad para condiciones selectivas, las precauciones se aplican. Sobredosis: Pngase en contacto inmediatamente con un centro toxicolgico o una sala de urgencia si usted cree que haya tomado demasiado medicamento. ATENCIN: [employer] es solo para usted. No comparta este medicamento con nadie. Qu sucede si me olvido de una dosis? Si olvida una dosis, tmela lo antes posible. Si es casi la hora de la prxima dosis, tome slo esa dosis. No tome dosis adicionales o dobles. Qu puede interactuar con este medicamento? No tome esta medicina con ninguno de los siguientes medicamentos: -alcohol o cualquier producto que contenga alcohol -solucin oral de amprenavir -disulfiram -inyeccin de paclitaxel -solucin oral de ritonavir -solucin oral de sertralina -inyeccin de sulfametoxasol-trimetoprima Esta medicina tambin puede interactuar con los siguientes medicamentos: -colestiramina -cimetidina -conivaptn -ciclosporina -fluorouracilo -fosfenitona, fenitona -quetoconazol -litio -fenobarbital -tacrolimo -warfarina Puede ser que esta lista no menciona todas las posibles interacciones. Informe a su profesional de la salud de [employer] productos a base de hierbas, medicamentos de [place] o  suplementos  nutritivos que est tomando. Si usted fuma, consume bebidas alcohlicas o si utiliza drogas ilegales, indqueselo tambin a su profesional de [employer]	[place]. Algunas sustancias pueden interactuar con su medicamento. A qu debo estar atento al usar [employer]? Si los sntomas no mejoran o si empeoran, consulte con su mdico o con su profesional de [employer]	[place]. Evite consumir las bebidas alcohlicas mientras toma este medicamento y durante tres das despus. El alcohol puede hacerle sentir mareado, enfermo o enrojecimiento. Si est recibiendo tratamiento para [place] enfermedad de transmisin sexual, evite todo contacto sexual hasta que haya terminado el [place]. Es posible que su pareja tambin necesite [place]. Qu efectos secundarios puedo tener al [employer] este medicamento? Efectos secundarios que debe informar a su mdico o a Barrister's clerk de la salud tan pronto como sea posible: -Chief of Staff como erupcin cutnea, picazn o urticarias, hinchazn de la cara, labios o lengua -problemas respiratorios -confusin, depresin -manchas oscuras o blancas en la boca -sensacin de desmayos o mareos, cadas -fiebre, infeccin -entumecimiento, hormigueo, dolor o debilidad en las manos o pies -dolor al orinar -convulsiones -cansancio o debilidad inusual -irritacin o flujo vaginal -vmito Efectos secundarios que, por lo general, no requieren atencin mdica (debe informarlos a su mdico o a su profesional de la salud si persisten o si son molestos): -orina de color marrn oscuro o rojo -diarrea -dolor de cabeza -prdida del apetito -sabor metlico -nuseas -Higher education careers adviser Puede ser que esta lista no menciona todos los posibles efectos secundarios. Comunquese a su mdico por asesoramiento mdico [employer]. Usted puede informar los efectos secundarios a la FDA por telfono al 1-800-FDA-1088. Dnde debo guardar mi medicina? Mantngala fuera  del alcance de los nios. Gurdela a [employer], entre 15 y 1 grados C (23 y 31 grados F). Protjala de la luz y de la humedad. Mantenga el envase bien cerrado. Deseche todo el medicamento que no haya utilizado, despus de la fecha de vencimiento. ATENCIN: Este folleto es un resumen. Puede ser que no cubra toda la posible informacin. Si usted tiene preguntas acerca de esta medicina, consulte con su mdico, su farmacutico o su profesional de Technical sales engineer.    2016, Elsevier/Gold Standard. (2014-03-26 00:00:00)   Vaginosis bacteriana (Bacterial Vaginosis) La vaginosis bacteriana es una infeccin vaginal que perturba el equilibrio normal de las bacterias que se encuentran en la vagina. Es el resultado de un crecimiento excesivo de ciertas bacterias. Esta es la infeccin vaginal ms frecuente en mujeres en edad reproductiva. El tratamiento es importante para prevenir complicaciones, especialmente en mujeres embarazadas, dado que puede causar un parto prematuro. CAUSAS  La vaginosis bacteriana se origina por un aumento de bacterias nocivas que, generalmente, estn presentes en cantidades ms pequeas en la vagina. Varios tipos diferentes de bacterias pueden causar esta afeccin. Sin embargo, la causa de su desarrollo no se comprende totalmente. [place] o comportamientos pueden exponerlo a un mayor riesgo de desarrollar vaginosis bacteriana, entre los que se incluyen:  Tener una nueva pareja sexual o mltiples parejas sexuales.  Las duchas vaginales  El uso del DIU (dispositivo intrauterino) como mtodo anticonceptivo. El contagio no se produce en baos, por ropas de cama, en piscinas o por contacto con objetos. SIGNOS Y SNTOMAS  Algunas mujeres que padecen vaginosis bacteriana no presentan signos ni sntomas. Los sntomas ms comunes son:  Secrecin vaginal de color grisceo.  Secrecin vaginal con olor similar al [employer], especialmente despus de  Retail banker.  Picazn o sensacin de ardor en  la vagina o la vulva.  Ardor o dolor al [employer]. DIAGNSTICO  Su mdico analizar su historia clnica y le examinar la vagina para detectar signos de vaginosis bacteriana. Puede tomarle Truddie Coco de flujo vaginal. Su mdico examinar esta muestra con un microscopio para controlar las bacterias y clulas anormales. Tambin puede realizarse un anlisis del pH vaginal.  TRATAMIENTO  La vaginosis bacteriana puede tratarse con antibiticos, en forma de comprimidos o de crema vaginal. Puede indicarse una segunda tanda de antibiticos si la afeccin se repite despus del tratamiento. Debido a que la vaginosis bacteriana aumenta el riesgo de contraer enfermedades de transmisin sexual, el tratamiento puede ayudar a reducir el riesgo de clamidia, [place], VIH y herpes. [place] solo medicamentos de venta libre o recetados, segn las indicaciones del mdico.  Si le han recetado antibiticos, tmelos como se le indic. Asegrese de que finaliza la prescripcin completa aunque se sienta mejor.  Comunique a sus compaeros sexuales que sufre una infeccin vaginal. Deben consultar a su mdico y recibir tratamiento si tienen problemas, como picazn o una erupcin cutnea leve.  Durante el [place], es importante que siga estas indicaciones:  Visual merchandiser relaciones sexuales o use preservativos de la forma correcta.  No se haga duchas vaginales.  Evite consumir alcohol como se lo haya indicado el mdico.  Community education officer se lo haya indicado el mdico. SOLICITE ATENCIN MDICA SI:   Sus sntomas no mejoran despus de 3 das de [place].  Aumenta la secrecin o Conservation officer, historic buildings.  Tiene fiebre. ASEGRESE DE QUE:   Comprende estas instrucciones.  Controlar su afeccin.  Recibir ayuda de inmediato si no mejora o si empeora. PARA OBTENER MS INFORMACIN  Centros para el control y la  prevencin de Probation officer for Disease Control and Prevention, CDC): AppraiserFraud.fi Asociacin Estadounidense de la Salud Sexual (American Sexual Health Association, SHA): www.ashastd.org    Esta informacin no tiene Marine scientist el consejo del mdico. Asegrese de hacerle al mdico cualquier pregunta que tenga.   Document Released: 05/11/2007 Document Revised: 02/22/2014 Elsevier Interactive Patient Education [employer].

## 2015-06-04 LAB — URINE CULTURE
Colony Count: NO GROWTH
ORGANISM ID, BACTERIA: NO GROWTH

## 2015-06-19 ENCOUNTER — Other Ambulatory Visit: Payer: Self-pay

## 2015-06-19 DIAGNOSIS — Z1231 Encounter for screening mammogram for malignant neoplasm of breast: Secondary | ICD-10-CM

## 2015-06-23 ENCOUNTER — Other Ambulatory Visit: Payer: Self-pay | Admitting: Podiatry

## 2015-06-23 NOTE — Telephone Encounter (Signed)
Pt needs an appt if continuing to have pain.

## 2015-08-13 ENCOUNTER — Ambulatory Visit
Admission: RE | Admit: 2015-08-13 | Discharge: 2015-08-13 | Disposition: A | Discharge status: Home / Self Care | Payer: 59 | Source: Ambulatory Visit

## 2015-08-13 DIAGNOSIS — Z1231 Encounter for screening mammogram for malignant neoplasm of breast: Secondary | ICD-10-CM

## 2015-08-15 ENCOUNTER — Other Ambulatory Visit: Payer: Self-pay | Admitting: Gynecology

## 2015-08-15 DIAGNOSIS — R928 Other abnormal and inconclusive findings on diagnostic imaging of breast: Secondary | ICD-10-CM

## 2015-08-25 ENCOUNTER — Telehealth: Payer: Self-pay | Admitting: *Deleted

## 2015-08-25 ENCOUNTER — Ambulatory Visit
Admission: RE | Admit: 2015-08-25 | Discharge: 2015-08-25 | Disposition: A | Discharge status: Home / Self Care | Payer: 59 | Source: Ambulatory Visit | Attending: Gynecology | Admitting: Gynecology

## 2015-08-25 DIAGNOSIS — R928 Other abnormal and inconclusive findings on diagnostic imaging of breast: Secondary | ICD-10-CM

## 2015-08-25 MED ORDER — LEVOTHYROXINE SODIUM 25 MCG PO TABS: ORAL_TABLET | ORAL | Status: DC

## 2015-08-25 MED ORDER — ATORVASTATIN CALCIUM 10 MG PO TABS: ORAL_TABLET | ORAL | Status: DC

## 2015-08-25 NOTE — Telephone Encounter (Signed)
-----   Message from [name] sent at 08/25/2015 11:42 AM EDT ----- Regarding: RX Patient needs her 2 prescription to be sent to [facility] fax [phone]. She states she only has 1 more week on both but she just found out that her prescription needed to be to mail order.  She needs levothyroxine (SYNTHROID, LEVOTHROID) 25 MCG tablet and atorvastatin (LIPITOR) 10 MG tablet. Thx

## 2016-03-30 ENCOUNTER — Ambulatory Visit (INDEPENDENT_AMBULATORY_CARE_PROVIDER_SITE_OTHER): Payer: 59 | Admitting: Gynecology

## 2016-03-30 ENCOUNTER — Encounter: Payer: Self-pay | Admitting: Gynecology

## 2016-03-30 VITALS — BP 110/80 | Ht 61.0 in | Wt 149.0 lb

## 2016-03-30 DIAGNOSIS — Z30432 Encounter for removal of intrauterine contraceptive device: Secondary | ICD-10-CM | POA: Diagnosis not present

## 2016-03-30 DIAGNOSIS — E782 Mixed hyperlipidemia: Secondary | ICD-10-CM

## 2016-03-30 DIAGNOSIS — N760 Acute vaginitis: Secondary | ICD-10-CM

## 2016-03-30 DIAGNOSIS — Z30011 Encounter for initial prescription of contraceptive pills: Secondary | ICD-10-CM

## 2016-03-30 DIAGNOSIS — B9689 Other specified bacterial agents as the cause of diseases classified elsewhere: Secondary | ICD-10-CM | POA: Diagnosis not present

## 2016-03-30 DIAGNOSIS — N898 Other specified noninflammatory disorders of vagina: Secondary | ICD-10-CM

## 2016-03-30 DIAGNOSIS — E038 Other specified hypothyroidism: Secondary | ICD-10-CM | POA: Diagnosis not present

## 2016-03-30 DIAGNOSIS — E559 Vitamin D deficiency, unspecified: Secondary | ICD-10-CM

## 2016-03-30 DIAGNOSIS — Z01411 Encounter for gynecological examination (general) (routine) with abnormal findings: Secondary | ICD-10-CM | POA: Diagnosis not present

## 2016-03-30 LAB — URINALYSIS W MICROSCOPIC + REFLEX CULTURE
BACTERIA UA: NONE SEEN [HPF]
BILIRUBIN URINE: NEGATIVE
CASTS: NONE SEEN [LPF]
CRYSTALS: NONE SEEN [HPF]
Glucose, UA: NEGATIVE
Hgb urine dipstick: NEGATIVE
KETONES UR: NEGATIVE
Leukocytes, UA: NEGATIVE
Nitrite: NEGATIVE
PROTEIN: NEGATIVE
RBC / HPF: NONE SEEN RBC/HPF (ref <=2)
SPECIFIC GRAVITY, URINE: 1.022 (ref 1.001–1.035)
WBC UA: NONE SEEN WBC/HPF (ref <=5)
Yeast: NONE SEEN [HPF]
pH: 5.5 (ref 5.0–8.0)

## 2016-03-30 LAB — WET PREP FOR TRICH, YEAST, CLUE
Trich, Wet Prep: NONE SEEN
Yeast Wet Prep HPF POC: NONE SEEN

## 2016-03-30 MED ORDER — LEVONORGESTREL-ETHINYL ESTRAD 0.1-20 MG-MCG PO TABS: 1.0000 | ORAL_TABLET | Freq: Every day | ORAL | 11 refills | Status: DC

## 2016-03-30 MED ORDER — DOXYCYCLINE HYCLATE 100 MG PO CAPS: 100.0000 mg | ORAL_CAPSULE | Freq: Two times a day (BID) | ORAL | 0 refills | Status: DC

## 2016-03-30 MED ORDER — METRONIDAZOLE 500 MG PO TABS: 500.0000 mg | ORAL_TABLET | Freq: Two times a day (BID) | ORAL | 0 refills | Status: DC

## 2016-03-30 MED ORDER — LEVOTHYROXINE SODIUM 25 MCG PO TABS: ORAL_TABLET | ORAL | 4 refills | Status: DC

## 2016-03-30 NOTE — Progress Notes (Signed)
Dawn Villegas 1966-01-25 [id number]   History:    51 y.o.  for annual gyn exam who is been complaining of a constant vaginal discharge. She had a Mirena IUD placed several years ago and is interested in having it removed today. Patient also has had history of Hyperlipidemia and she has not been taking her Lipitor since she ran out over a month ago. She also has had past history vitamin D deficiency. Patient with no previous history of any abnormal Pap smears. She received the flu vaccine at work. Her sister had some form of GI malignancy and for this reason the patient had a colonoscopy in 2016 which was according to her report be normal.  Past medical history,surgical history, family history and social history were all reviewed and documented in the EPIC chart.  Gynecologic History Patient's last menstrual period was 03/12/2016. Contraception: IUD Last Pap: 2017. Results were: normal Last mammogram: 2017. Results were: Normal but dense had three-dimensional mammogram  Obstetric History OB History  Gravida Para Term Preterm AB Living  5 4     1 4   SAB TAB Ectopic Multiple Live Births  1            # Outcome Date GA Lbr Len/2nd Weight Sex Delivery Anes PTL Lv  5 SAB           4 Para           3 Para           2 Para           1 Para                ROS: A ROS was performed and pertinent positives and negatives are included in the history.  GENERAL: No fevers or chills. HEENT: No change in vision, no earache, sore throat or sinus congestion. NECK: No pain or stiffness. CARDIOVASCULAR: No chest pain or pressure. No palpitations. PULMONARY: No shortness of breath, cough or wheeze. GASTROINTESTINAL: No abdominal pain, nausea, vomiting or diarrhea, melena or bright red blood per rectum. GENITOURINARY: No urinary frequency, urgency, hesitancy or dysuria. MUSCULOSKELETAL: No joint or muscle pain, no back pain, no recent trauma. DERMATOLOGIC: No rash, no itching, no lesions. ENDOCRINE: No  polyuria, polydipsia, no heat or cold intolerance. No recent change in weight. HEMATOLOGICAL: No anemia or easy bruising or bleeding. NEUROLOGIC: No headache, seizures, numbness, tingling or weakness. PSYCHIATRIC: No depression, no loss of interest in normal activity or change in sleep pattern.     Exam: chaperone present  BP 110/80   Ht 5\' 1"  (1.549 m)   Wt 149 lb (67.6 kg)   LMP 03/12/2016   BMI 28.15 kg/m   Body mass index is 28.15 kg/m.  General appearance : Well developed well nourished female. No acute distress HEENT: Eyes: no retinal hemorrhage or exudates,  Neck supple, trachea midline, no carotid bruits, no thyroidmegaly Lungs: Clear to auscultation, no rhonchi or wheezes, or rib retractions  Heart: Regular rate and rhythm, no murmurs or gallops Breast:Examined in sitting and supine position were symmetrical in appearance, no palpable masses or tenderness,  no skin retraction, no nipple inversion, no nipple discharge, no skin discoloration, no axillary or supraclavicular lymphadenopathy Abdomen: no palpable masses or tenderness, no rebound or guarding Extremities: no edema or skin discoloration or tenderness  Pelvic:  Bartholin, Urethra, Skene Glands: Within normal limits             Vagina: No gross lesions or discharge  Cervix: No gross lesions or discharge, IUD string not visualized  Uterus  anteverted, normal size, shape and consistency, non-tender and mobile  Adnexa  Without masses or tenderness  Anus and perineum  normal   Rectovaginal  normal sphincter tone without palpated masses or tenderness             Hemoccult not done   Wet prep moderate clue cells few WBC too numerous to count bacteria  Assessment/Plan:  51 y.o. female for annual exam with clinical evidence of bacterial vaginosis will be treated with Flagyl 500 mg one by mouth twice a day for 7 days. Several attempts were made to remove the IUD unsuccessfully and for this reason she'll return back to the  office next week after infection is taking care of and retrieve it under ultrasound guidance. At that time she'll come to the office in the morning a fasting state following screening blood work: Comprehensive metabolic panel, fasting lipid profile, TSH, CBC, and urinalysis. Patient due for mammogram in July of this year. Pap smear not needed this year. We'll hold off on re-prescribing her Lipitor so we can see what her lipid profile shows and also because of her history vitamin D deficiency will check her vitamin D level as well today. She does have history of hypothyroidism and refill for her Synthroid were provided also. Once the Mirena IUD is removed she will start a 20 g oral contraceptive pill for contraception cycle control and transitioned to the menopause and then discontinue at the age of 23. The risks benefits and pros and cons of the medication were discussed and literature information was provided.   Terrance Mass MD, 5:00 PM 03/30/2016

## 2016-03-30 NOTE — Patient Instructions (Addendum)
Informacin sobre los anticonceptivos orales (Oral Contraception Information) Los anticonceptivos orales (ACO) son medicamentos que se utilizan para evitar el embarazo. Su funcin es evitar que los ovarios liberen vulos. Las hormonas de los ACO tambin hacen que el moco cervical se haga ms espeso, lo que evita que el esperma ingrese al tero. Tambin hacen que la membrana que recubre internamente al tero se vuelva ms fina, lo que no permite que el huevo fertilizado se adhiera a la pared del tero. Los ACO son muy efectivos cuando se toman exactamente como se prescriben. Sin embargo, no previenen contra las enfermedades de transmisin sexual (ETS). La prctica del sexo seguro, como el uso de preservativos, junto con la pldora, ayudan a prevenir ese tipo de enfermedades. Antes de tomar la pldora, usted debe hacerse un examen fsico y un test de Pap. El mdico podr indicarle anlisis de sangre, si es necesario. El mdico se asegurar de que usted sea una buena candidata para usar anticonceptivos orales. Converse con su mdico acerca de los posibles efectos secundarios de los ACO que podran recetarle. Cuando se inicia el uso de ACO, puede llevar 2 a 3 meses para que su organismo se adapte a los cambios en los niveles hormonales. TIPOS DE ANTICONCEPTIVOS ORALES  Pldora combinada: esta pldora contiene las hormonas estrgeno y progestina (progesterona sinttica). La pldora combinada viene en envases para 21 das, 28 das o 91 das. Algunos tipos de pldoras combinadas deben tomarse de manera continua (pldoras para 365 das). En los envases para 21 das, usted no tomar las pldoras durante 7 das despus de la ltima pldora. En los envases para 28 das, la pldora se toma todos los das. Las ltimas 7 no contienen hormonas. Ciertos tipos de pldoras tienen ms de 21 pldoras que contienen hormonas. En los envases para 91 das, las primeras 84 pldoras contienen ambas hormonas y las ltimas 7 pldoras no  contienen hormonas o contienen slo estrgenos.  La minipldora: esta pldora contiene la hormona progesterona solamente. Es necesario tomarla todos los das de manera continua. Es importante que las tome a la misma hora todos los das. Viene en envases de 28 pldoras. Las 28 pldoras contienen la hormona. VENTAJAS  DE LOS ANTICONCEPTIVOS ORALES  Disminuye los sntomas premenstruales.  Se usa para tratar los clicos menstruales.  Regula el ciclo menstrual.  Disminuye el ciclo menstrual abundante.  Puede mejorar el acn, segn el tipo de pldora.  Trata hemorragias uterinas anormales.  Trata el sndrome ovrico poliqustico.  Trata la endometriosis.  Pueden usarse como anticonceptivo de emergencia. FACTORES QUE PUEDEN HACER QUE LOS ANTICONCEPTIVOS ORALES SEAN MENOS EFECTIVOS Pueden ser menos efectivos si:  Olvid tomar la pldora todos los das a la misma hora.  Tiene una enfermedad estomacal o intestinal que disminuye la absorcin de la pldora.  Ingiere simultneamente los anticonceptivos orales junto con otros medicamentos que los hacen menos efectivos, como antibiticos, ciertos medicamentos para el VIH y algunos medicamentos para las convulsiones.  Usted toma anticonceptivos orales que han vencido.  Cuando se usa el envase de 21 das, se olvida de recomenzar el uso en el da 7. RIESGOS ASOCIADOS AL USO DE ANTICONCEPTIVOS ORALES Los anticonceptivos orales pueden en algunos casos causar efectos secundarios como:  Dolor de cabeza.  Nuseas.  Inflamacin mamaria.  Hemorragia vaginal o manchado irregular. Las pldoras combinadas tambin se asocian a un pequeo aumento en el riesgo de:  Cogulos sanguneos.  Ataque cardaco.  Ictus. Esta informacin no tiene como fin reemplazar el consejo del mdico. Asegrese   de hacerle al mdico cualquier pregunta que tenga. Document Released: 11/11/2004 Document Revised: 05/26/2015 Document Reviewed: 07/23/2012 Elsevier  Interactive Patient Education  2017 [place] bacteriana (Bacterial Vaginosis) La vaginosis bacteriana es una infeccin vaginal que perturba el equilibrio normal de las bacterias que se encuentran en la vagina. Es el resultado de un crecimiento excesivo de ciertas bacterias. Esta es la infeccin vaginal ms frecuente en mujeres en edad reproductiva. El tratamiento es importante para prevenir complicaciones, especialmente en mujeres embarazadas, dado que puede causar un parto prematuro. CAUSAS La vaginosis bacteriana se origina por un aumento de bacterias nocivas que, generalmente, estn presentes en cantidades ms pequeas en la vagina. Varios tipos diferentes de bacterias pueden causar esta afeccin. Sin embargo, la causa de su desarrollo no se comprende totalmente. [place] o comportamientos pueden exponerlo a un mayor riesgo de desarrollar vaginosis bacteriana, entre los que se incluyen:  Tener una nueva pareja sexual o mltiples parejas sexuales.  Las duchas vaginales  El uso del DIU (dispositivo intrauterino) como mtodo anticonceptivo. El contagio no se produce en baos, por ropas de cama, en piscinas o por contacto con objetos. SIGNOS Y SNTOMAS Algunas mujeres que padecen vaginosis bacteriana no presentan signos ni sntomas. Los sntomas ms comunes son:  Secrecin vaginal de color grisceo.  Secrecin vaginal con olor similar al [employer], especialmente despus de Retail banker.  Picazn o sensacin de ardor en la vagina o la vulva.  Ardor o dolor al [employer]. DIAGNSTICO Su mdico analizar su historia clnica y le examinar la vagina para detectar signos de vaginosis bacteriana. Puede tomarle Truddie Coco de flujo vaginal. Su mdico examinar esta muestra con un microscopio para controlar las bacterias y clulas anormales. Tambin puede realizarse un anlisis del pH vaginal. TRATAMIENTO La vaginosis bacteriana puede  tratarse con antibiticos, en forma de comprimidos o de crema vaginal. Puede indicarse una segunda tanda de antibiticos si la afeccin se repite despus del tratamiento. Debido a que la vaginosis bacteriana aumenta el riesgo de contraer enfermedades de transmisin sexual, el tratamiento puede ayudar a reducir el riesgo de clamidia, [place], VIH y herpes. [place] solo medicamentos de venta libre o recetados, segn las indicaciones del mdico.  Si le han recetado antibiticos, tmelos como se le indic. Asegrese de que finaliza la prescripcin completa aunque se sienta mejor.  Comunique a sus compaeros sexuales que sufre una infeccin vaginal. Deben consultar a su mdico y recibir tratamiento si tienen problemas, como picazn o una erupcin cutnea leve.  Durante el [place], es importante que siga estas indicaciones:  Visual merchandiser relaciones sexuales o use preservativos de la forma correcta.  No se haga duchas vaginales.  Evite consumir alcohol como se lo haya indicado el mdico.  Community education officer se lo haya indicado el mdico. SOLICITE ATENCIN MDICA SI:  Sus sntomas no mejoran despus de 3 das de [place].  Aumenta la secrecin o Conservation officer, historic buildings.  Tiene fiebre. ASEGRESE DE QUE:  Comprende estas instrucciones.  Controlar su afeccin.  Recibir ayuda de inmediato si no mejora o si empeora. PARA OBTENER MS INFORMACIN Centros para el control y la prevencin de Probation officer for Disease Control and Prevention, CDC): AppraiserFraud.fi Asociacin Estadounidense de la Salud Sexual (American Sexual Health Association, SHA): www.ashastd.org Esta informacin no tiene Marine scientist el consejo del mdico. Asegrese de hacerle al mdico cualquier pregunta que tenga. Document Released: 05/11/2007 Document Revised: 02/22/2014 Document Reviewed: 09/13/2012 Elsevier Interactive Patient Education  2017 Elsevier Inc.  

## 2016-04-01 ENCOUNTER — Telehealth: Payer: Self-pay | Admitting: *Deleted

## 2016-04-01 MED ORDER — METRONIDAZOLE 500 MG PO TABS: 500.0000 mg | ORAL_TABLET | Freq: Two times a day (BID) | ORAL | 0 refills | Status: DC

## 2016-04-01 NOTE — Telephone Encounter (Signed)
Pt daughter called pt was seen on 03/30/16 and Rx for flagyl 500 mg was sent to wrong pharmacy. Rx should have been sent to local pharmacy, not mail order. Rx sent to [facility].

## 2016-04-07 ENCOUNTER — Ambulatory Visit: Payer: 59 | Admitting: Gynecology

## 2016-04-07 ENCOUNTER — Other Ambulatory Visit: Payer: 59

## 2016-04-21 ENCOUNTER — Ambulatory Visit (HOSPITAL_COMMUNITY)
Admission: RE | Admit: 2016-04-21 | Discharge: 2016-04-21 | Disposition: A | Discharge status: Home / Self Care | Payer: 59 | Source: Ambulatory Visit | Attending: Gynecology | Admitting: Gynecology

## 2016-04-21 ENCOUNTER — Other Ambulatory Visit: Payer: Self-pay | Admitting: Gynecology

## 2016-04-21 ENCOUNTER — Encounter: Payer: Self-pay | Admitting: Gynecology

## 2016-04-21 ENCOUNTER — Ambulatory Visit (INDEPENDENT_AMBULATORY_CARE_PROVIDER_SITE_OTHER): Payer: 59 | Admitting: Gynecology

## 2016-04-21 ENCOUNTER — Ambulatory Visit (INDEPENDENT_AMBULATORY_CARE_PROVIDER_SITE_OTHER): Payer: 59

## 2016-04-21 DIAGNOSIS — T8389XS Other specified complication of genitourinary prosthetic devices, implants and grafts, sequela: Principal | ICD-10-CM

## 2016-04-21 DIAGNOSIS — Z30432 Encounter for removal of intrauterine contraceptive device: Secondary | ICD-10-CM

## 2016-04-21 DIAGNOSIS — N920 Excessive and frequent menstruation with regular cycle: Secondary | ICD-10-CM

## 2016-04-21 DIAGNOSIS — N939 Abnormal uterine and vaginal bleeding, unspecified: Secondary | ICD-10-CM

## 2016-04-21 DIAGNOSIS — T8332XS Displacement of intrauterine contraceptive device, sequela: Secondary | ICD-10-CM

## 2016-04-21 DIAGNOSIS — R9389 Abnormal findings on diagnostic imaging of other specified body structures: Secondary | ICD-10-CM

## 2016-04-21 DIAGNOSIS — Z30431 Encounter for routine checking of intrauterine contraceptive device: Secondary | ICD-10-CM | POA: Diagnosis not present

## 2016-04-21 DIAGNOSIS — R938 Abnormal findings on diagnostic imaging of other specified body structures: Secondary | ICD-10-CM

## 2016-04-21 LAB — LIPID PANEL
Cholesterol: 213 mg/dL — ABNORMAL HIGH (ref <200)
HDL: 67 mg/dL (ref >50)
LDL CALC: 123 mg/dL — AB (ref <100)
Total CHOL/HDL Ratio: 3.2 Ratio (ref <5.0)
Triglycerides: 114 mg/dL (ref <150)
VLDL: 23 mg/dL (ref <30)

## 2016-04-21 LAB — COMPREHENSIVE METABOLIC PANEL
ALT: 12 U/L (ref 6–29)
AST: 17 U/L (ref 10–35)
Albumin: 3.9 g/dL (ref 3.6–5.1)
Alkaline Phosphatase: 55 U/L (ref 33–115)
BILIRUBIN TOTAL: 0.3 mg/dL (ref 0.2–1.2)
BUN: 14 mg/dL (ref 7–25)
CHLORIDE: 106 mmol/L (ref 98–110)
CO2: 21 mmol/L (ref 20–31)
CREATININE: 0.74 mg/dL (ref 0.50–1.10)
Calcium: 8.9 mg/dL (ref 8.6–10.2)
Glucose, Bld: 93 mg/dL (ref 65–99)
Potassium: 4.4 mmol/L (ref 3.5–5.3)
SODIUM: 137 mmol/L (ref 135–146)
TOTAL PROTEIN: 6.8 g/dL (ref 6.1–8.1)

## 2016-04-21 LAB — CBC WITH DIFFERENTIAL/PLATELET
BASOS ABS: 73 {cells}/uL (ref 0–200)
Basophils Relative: 1 %
EOS PCT: 2 %
Eosinophils Absolute: 146 cells/uL (ref 15–500)
HCT: 39 % (ref 35.0–45.0)
HEMOGLOBIN: 12.3 g/dL (ref 11.7–15.5)
LYMPHS ABS: 1825 {cells}/uL (ref 850–3900)
Lymphocytes Relative: 25 %
MCH: 27.1 pg (ref 27.0–33.0)
MCHC: 31.5 g/dL — ABNORMAL LOW (ref 32.0–36.0)
MCV: 85.9 fL (ref 80.0–100.0)
MONOS PCT: 6 %
MPV: 10.4 fL (ref 7.5–12.5)
Monocytes Absolute: 438 cells/uL (ref 200–950)
NEUTROS ABS: 4818 {cells}/uL (ref 1500–7800)
Neutrophils Relative %: 66 %
PLATELETS: 369 10*3/uL (ref 140–400)
RBC: 4.54 MIL/uL (ref 3.80–5.10)
RDW: 14.6 % (ref 11.0–15.0)
WBC: 7.3 10*3/uL (ref 3.8–10.8)

## 2016-04-21 LAB — TSH: TSH: 7.59 mIU/L — ABNORMAL HIGH

## 2016-04-21 NOTE — Progress Notes (Signed)
   Patient is a 51 year old who was seen in the office for her annual exam in February of this year. Patient was having complaints of constant vaginal discharge since she's had an IUD for about 2 years. She also was having heavier cycles. She wanted to have the IUD removed. Her blood work during that visit consisting of a CBC, comprehensive metabolic panel, fasting lipid profile, TSH, and urinalysis were all normal. She was prescribed 20 g oral contraceptive pill which she was to start after the start of her next cycle which will start at the end of this month. During that examination the IUD string was not visualized and several attempts with a [place] clamp was unsuccessful in retrieving the IUD and she was here for a sonohysterogram to see the IUD still in place and also because of her heavy.Marland Kitchen  Ultrasound/sono hysterogram: Uterus measured 11.1 x 6.3 x 4 x 5.1 cm and a major stripe 17.9 mm. (Last menstrual period 11 days ago) right ovary normal left ovary thick wall corpus luteum cyst measuring 21 x 16 x 18 mm. The cervix is then cleansed with Betadine solution and sterile catheter was introduced into the uterine cavity no defect was noted IUD not seen.  Assessment/plan: There is a possibility that when patient was in the office last month as we attempted to remove the IUD we may have loosened the IUD and she may have expulsed it at time of her heavy menstrual cycle that she had recently that she noted large clots. Patient otherwise asymptomatic. We are going to send her to the hospital to get a KUB of the abdomen to make sure the IUD is not anywhere the abdominal cavity. On ultrasound images the uterine surface appeared to be intact with no disruption abnormality noted. Patient otherwise to start her oral contraceptive pill on the second day of her upcoming menstrual cycle meanwhile use barrier contraception. And her annual exam will be due next year.

## 2016-04-22 ENCOUNTER — Other Ambulatory Visit: Payer: Self-pay | Admitting: Anesthesiology

## 2016-04-22 LAB — VITAMIN D 25 HYDROXY (VIT D DEFICIENCY, FRACTURES): VIT D 25 HYDROXY: 35 ng/mL (ref 30–100)

## 2016-04-22 MED ORDER — LEVOTHYROXINE SODIUM 50 MCG PO TABS: 50.0000 ug | ORAL_TABLET | Freq: Every day | ORAL | 4 refills | Status: DC

## 2016-05-02 ENCOUNTER — Other Ambulatory Visit: Payer: Self-pay | Admitting: Gynecology

## 2016-06-17 ENCOUNTER — Other Ambulatory Visit: Payer: Self-pay | Admitting: *Deleted

## 2016-06-17 DIAGNOSIS — E038 Other specified hypothyroidism: Secondary | ICD-10-CM

## 2016-06-18 ENCOUNTER — Other Ambulatory Visit: Payer: Self-pay | Admitting: Gynecology

## 2016-06-18 ENCOUNTER — Other Ambulatory Visit: Payer: 59

## 2016-06-18 DIAGNOSIS — E038 Other specified hypothyroidism: Secondary | ICD-10-CM

## 2016-06-19 LAB — TSH: TSH: 2.35 mIU/L

## 2016-06-28 ENCOUNTER — Telehealth: Payer: Self-pay | Admitting: *Deleted

## 2016-06-28 MED ORDER — LEVOTHYROXINE SODIUM 50 MCG PO TABS: 50.0000 ug | ORAL_TABLET | Freq: Every day | ORAL | 0 refills | Status: DC

## 2016-06-28 MED ORDER — LEVOTHYROXINE SODIUM 50 MCG PO TABS: 50.0000 ug | ORAL_TABLET | Freq: Every day | ORAL | 2 refills | Status: DC

## 2016-06-28 NOTE — Telephone Encounter (Signed)
Pt called told Dawn Villegas she is completely out of her synthroid 50 mcg, would like 90 day supply sent to local pharmacy, I will send remaining to mail order.

## 2016-06-30 ENCOUNTER — Encounter: Payer: Self-pay | Admitting: Gynecology

## 2016-07-13 ENCOUNTER — Other Ambulatory Visit: Payer: Self-pay | Admitting: *Deleted

## 2016-07-13 MED ORDER — LEVOTHYROXINE SODIUM 50 MCG PO TABS: 50.0000 ug | ORAL_TABLET | Freq: Every day | ORAL | 2 refills | Status: DC

## 2016-10-25 ENCOUNTER — Ambulatory Visit (INDEPENDENT_AMBULATORY_CARE_PROVIDER_SITE_OTHER): Payer: 59 | Admitting: Women's Health

## 2016-10-25 ENCOUNTER — Encounter: Payer: Self-pay | Admitting: Women's Health

## 2016-10-25 VITALS — BP 114/62

## 2016-10-25 DIAGNOSIS — R310 Gross hematuria: Secondary | ICD-10-CM | POA: Diagnosis not present

## 2016-10-25 DIAGNOSIS — N898 Other specified noninflammatory disorders of vagina: Secondary | ICD-10-CM | POA: Diagnosis not present

## 2016-10-25 LAB — WET PREP FOR TRICH, YEAST, CLUE

## 2016-10-25 NOTE — Progress Notes (Signed)
Interpreter Dawn Villegas) present for entirety of visit.   Dawn Villegas presents with burning with urination since 09/05, that increased significantly by  09/07. Today she has noticed blood with wiping on 3 occasions. Denies urgency but has noticed increased frequency. Denies pain at end of stream, odor or increased discharge. Alesse with monthly cycle, LMP 09/2016. . Currently taking Pirimir ([place] version of Azo) for symptoms. Of note: IUD in place since 2016- plan for removal in 04/2016 but MD unable to locate string, not visualized on pelvic or abdominal US.    Exam: Appears well, no CVAT, abd soft non tender, no rebound. External genitalia WNL, Speculum exam scant brown blood, no erythema, odor noted,. Wet mount negative. UA: negative leukocytes, 6-10 WBC's, 10-20 RBC's, few bacteria,   Urinary frequency with pain  Plan: Urine culture pending, Continue using Pirimir as needed for symptom relief.

## 2016-10-25 NOTE — Patient Instructions (Signed)
Infeccin urinaria en los adultos  (Urinary Tract Infection, Adult)  Una infeccin urinaria (IU) puede ocurrir en cualquier lugar de las vas urinarias. Las vas urinarias incluyen lo siguiente:   Riones.   Urteres.   Vejiga.   Uretra.  Estos rganos fabrican, almacenan y eliminan la orina del organismo.  CUIDADOS EN EL HOGAR   Tome los medicamentos de venta libre y los recetados solamente como se lo haya indicado el mdico.   Si le recetaron un antibitico, tmelo como se lo haya indicado el mdico. No deje de tomar los antibiticos aunque comience a sentirse mejor.   Evite beber lo siguiente:  ? Alcohol.  ? Cafena.  ? T.  ? Bebidas con gas.   Beba suficiente lquido para mantener el pis claro o de color amarillo plido.   Concurra a todas las visitas de control como se lo haya indicado el mdico. Esto es importante.   Asegrese de lo siguiente:  ? Vaciar la vejiga con frecuencia y en su totalidad. No contener la orina durante largos perodos.  ? Vaciar la vejiga antes y despus de tener relaciones sexuales.  ? Limpiar de adelante hacia atrs despus de defecar, si es mujer. Usar cada trozo de papel una vez cuando se limpie.  SOLICITE AYUDA SI:   Siente dolor en la espalda.   Tiene fiebre.   Siente malestar estomacal (nuseas).   Vomita.   Los sntomas no mejoran despus de 3das de tratamiento.   Los sntomas desaparecen y luego reaparecen.  SOLICITE AYUDA DE INMEDIATO SI:   Siente un dolor muy intenso en la espalda.   Siente un dolor muy intenso en la parte inferior del abdomen.   Tiene vmitos y no puede retener los medicamentos ni el agua.  Esta informacin no tiene como fin reemplazar el consejo del mdico. Asegrese de hacerle al mdico cualquier pregunta que tenga.  Document Released: 07/22/2009 Document Revised: 05/26/2015 Document Reviewed: 12/23/2014  Elsevier Interactive Patient Education  2018 Elsevier Inc.

## 2016-10-26 LAB — URINALYSIS, ROUTINE W REFLEX MICROSCOPIC
BILIRUBIN URINE: NEGATIVE
Glucose, UA: NEGATIVE
Hyaline Cast: NONE SEEN /LPF
KETONES UR: NEGATIVE
LEUKOCYTES UA: NEGATIVE
Nitrite: POSITIVE — AB
PH: 5.5 (ref 5.0–8.0)
Protein, ur: NEGATIVE
SPECIFIC GRAVITY, URINE: 1.02 (ref 1.001–1.03)

## 2016-10-26 LAB — NOTE

## 2016-10-27 ENCOUNTER — Other Ambulatory Visit: Payer: 59

## 2016-10-27 ENCOUNTER — Other Ambulatory Visit: Payer: Self-pay | Admitting: Women's Health

## 2016-10-27 DIAGNOSIS — N3091 Cystitis, unspecified with hematuria: Secondary | ICD-10-CM

## 2016-10-29 ENCOUNTER — Other Ambulatory Visit: Payer: Self-pay | Admitting: Women's Health

## 2016-10-29 MED ORDER — CIPROFLOXACIN HCL 250 MG PO TABS: 250.0000 mg | ORAL_TABLET | Freq: Two times a day (BID) | ORAL | 0 refills | Status: DC

## 2016-10-30 LAB — URINE CULTURE
MICRO NUMBER:: 81011653
SPECIMEN QUALITY: ADEQUATE

## 2016-10-30 LAB — URINALYSIS W MICROSCOPIC + REFLEX CULTURE
BACTERIA UA: NONE SEEN /HPF
Bilirubin Urine: NEGATIVE
GLUCOSE, UA: NEGATIVE
HGB URINE DIPSTICK: NEGATIVE
HYALINE CAST: NONE SEEN /LPF
KETONES UR: NEGATIVE
Nitrites, Initial: POSITIVE — AB
PH: 6 (ref 5.0–8.0)
PROTEIN: NEGATIVE
RBC / HPF: NONE SEEN /HPF (ref 0–2)
Specific Gravity, Urine: 1.011 (ref 1.001–1.03)
Squamous Epithelial / LPF: NONE SEEN /HPF (ref <=5)

## 2016-10-30 LAB — CULTURE INDICATED

## 2016-11-01 ENCOUNTER — Telehealth: Payer: Self-pay | Admitting: *Deleted

## 2016-11-01 MED ORDER — CIPROFLOXACIN HCL 250 MG PO TABS: 250.0000 mg | ORAL_TABLET | Freq: Two times a day (BID) | ORAL | 0 refills | Status: DC

## 2016-11-01 NOTE — Telephone Encounter (Signed)
Patient speaks Spanish?. Cipro is effective for organisms noted for UTI, ask if she is  25% better, 50% better or 75% better? If less than 75% better repeat the Cipro 250 for 3 days #6

## 2016-11-01 NOTE — Telephone Encounter (Signed)
Pt was treated with Cipro 250 mg x 3 days BID, states medication didn't help still have burning with urination. Please advise

## 2016-11-01 NOTE — Telephone Encounter (Signed)
Yes patient is spanish speaking, Elianis said pt has seen no improvement Rx sent.

## 2016-11-16 ENCOUNTER — Ambulatory Visit (INDEPENDENT_AMBULATORY_CARE_PROVIDER_SITE_OTHER): Payer: 59 | Admitting: Women's Health

## 2016-11-16 ENCOUNTER — Encounter: Payer: Self-pay | Admitting: Women's Health

## 2016-11-16 VITALS — BP 110/68

## 2016-11-16 DIAGNOSIS — B3731 Acute candidiasis of vulva and vagina: Secondary | ICD-10-CM

## 2016-11-16 DIAGNOSIS — B373 Candidiasis of vulva and vagina: Secondary | ICD-10-CM

## 2016-11-16 LAB — WET PREP FOR TRICH, YEAST, CLUE

## 2016-11-16 MED ORDER — FLUCONAZOLE 150 MG PO TABS: 150.0000 mg | ORAL_TABLET | Freq: Once | ORAL | 1 refills | Status: AC

## 2016-11-16 NOTE — Progress Notes (Signed)
52 year old MHF G5 P4, interpreter present. Presents with complaint of vaginal irritation with itching for the past week. Was treated for a UTI with Cipro 10/27/2016. Cycling monthly on Alesse. Denies abdominal pain, fever, urinary symptoms, frequency, pain with urination, slight burning at initiation of stream only.  Hypothyroidism and hypercholesterolemia primary care manages.  Exam: Appears well, external genitalia extremely erythematous, wet prep done with a Q-tip. Wet prep positive for yeast.  Yeast vaginitis  Plan: Diflucan 150 by mouth times one dose with refill. Reviewed [place] infection most likely caused from antibiotic use after UTI. Yeast  prevention discussed. Instructed to call if no relief.

## 2016-11-16 NOTE — Patient Instructions (Signed)
Candidiasis vaginal en los adultos °(Gastrointestinal Yeast Infection, Adult) °La candidiasis vaginal es una afección que causa dolor, hinchazón y enrojecimiento (inflamación) de la vagina. También causa secreción vaginal. Esta es una enfermedad frecuente. Algunas mujeres contraen esta infección con frecuencia. °CAUSAS °La causa de la infección es un cambio en el equilibrio normal de los hongos (cándida) y las bacterias que viven en la vagina. Esta alteración deriva en el crecimiento excesivo de los hongos, lo que causa la inflamación. °FACTORES DE RIESGO °Es más probable que esta afección se manifieste en: °· Las mujeres que toman antibióticos. °· Las mujeres que tienen diabetes. °· Las mujeres que toman anticonceptivos. °· Las mujeres que están embarazadas. °· Las mujeres que se hacen duchas vaginales con frecuencia. °· Las mujeres que tienen un sistema de defensa (inmunitario) débil. °· Las mujeres que han tomado corticoides durante mucho tiempo. °· Las mujeres que usan ropa ajustada con frecuencia. °SÍNTOMAS °Los síntomas de esta afección incluyen lo siguiente: °· Secreción vaginal blanca y espesa. °· Hinchazón, picazón, enrojecimiento e irritación de la vagina. Los labios de la vagina (vulva) también se pueden infectar. °· Dolor o ardor al orinar. °· Dolor durante las relaciones sexuales. °DIAGNÓSTICO °Esta afección se diagnostica mediante la historia clínica y un examen físico. Este incluye un examen pélvico. El médico examinará una muestra de la secreción vaginal con un microscopio. Probablemente el médico envíe esta muestra al laboratorio para analizarla y confirmar el diagnóstico. °TRATAMIENTO °Esta afección se trata con medicamentos. Los medicamentos pueden ser recetados o de venta libre. Podrán indicarle que use uno o más de lo siguiente: °· Medicamentos por vía oral. °· Medicamentos que se aplican como una crema. °· Medicamentos que se colocan directamente en la vagina (óvulos vaginales). °INSTRUCCIONES  PARA EL CUIDADO EN EL HOGAR °· Tome o aplíquese los medicamentos de venta libre y recetados solamente como se lo haya indicado el médico. °· No tenga relaciones sexuales hasta que el médico lo autorice. Comunique a su compañero sexual que tiene una infección por hongos. Esas personas deben consultar al médico si tienen síntomas. °· No use ropa ajustada, como pantis o pantalones ajustados. °· Evite el uso de tampones hasta que el médico lo autorice. °· Consuma más yogur. Esto puede ayudar a evitar la recurrencia de la candidiasis. °· Intente darse un baño de asiento para aliviar las molestias. Se trata de un baño de agua tibia que se toma mientras se está sentado. El agua solo debe llegar hasta las caderas y cubrir las nalgas. Hágalo 3 o 4 veces al día o como se lo haya indicado el médico. °· No se haga duchas vaginales. °· Use ropa interior transpirable de algodón. °· Si tiene diabetes, mantenga bajo control los niveles de azúcar en la sangre. °SOLICITE ATENCIÓN MÉDICA SI: °· Tiene fiebre. °· Los síntomas desaparecen y luego reaparecen. °· Los síntomas no mejoran con el tratamiento. °· Los síntomas empeoran. °· Aparecen nuevos síntomas. °· Aparecen ampollas alrededor o adentro de la vagina. °· Le sale sangre de la vagina y no está menstruando. °· Siente dolor en el abdomen. °Esta información no tiene como fin reemplazar el consejo del médico. Asegúrese de hacerle al médico cualquier pregunta que tenga. °Document Released: 11/11/2004 Document Revised: 05/26/2015 Document Reviewed: 08/05/2014 °Elsevier Interactive Patient Education © 2018 Elsevier Inc. ° °

## 2016-11-18 ENCOUNTER — Telehealth: Payer: Self-pay

## 2016-11-18 NOTE — Telephone Encounter (Addendum)
Patient called and spoke with [facility]. Continues to be uncomfortable with external itching, even into bend of her leg.  Took the Diflucan you prescribed. No vaginal  Discharge. No internal itching. Rec?

## 2016-11-19 MED ORDER — TERCONAZOLE 0.4 % VA CREA: 1.0000 | TOPICAL_CREAM | Freq: Every day | VAGINAL | 0 refills | Status: DC

## 2016-11-19 NOTE — Telephone Encounter (Signed)
Ikeisha will relay, Rx sent.

## 2016-11-19 NOTE — Telephone Encounter (Signed)
Try terazol 7 apply externally twice daily until relief.

## 2017-01-18 ENCOUNTER — Encounter: Payer: Self-pay | Admitting: Women's Health

## 2017-01-18 ENCOUNTER — Ambulatory Visit (INDEPENDENT_AMBULATORY_CARE_PROVIDER_SITE_OTHER): Payer: 59 | Admitting: Women's Health

## 2017-01-18 VITALS — BP 112/72

## 2017-01-18 DIAGNOSIS — N898 Other specified noninflammatory disorders of vagina: Secondary | ICD-10-CM | POA: Diagnosis not present

## 2017-01-18 LAB — WET PREP FOR TRICH, YEAST, CLUE

## 2017-01-18 NOTE — Progress Notes (Signed)
51 year old MHF G5 P4 presents with complaint of questionable bump on left labia. Noticed several days ago and has become smaller but it is still uncomfortable. Denies vaginal discharge, urinary symptoms, abdominal pain or fever.  Hypothyroid primary care manages. Is having a light monthly cycle on Alesse.  Spanish speaking, phone interpreter utilized.   Exam: Appears well. External genitalia on left lower labia 2 cm slightly tender folliculitis, not indurated. Speculum exam scant discharge wet prep negative.  Folliculitis  Plan: Instructed to soak in a warm tub, loose clothing, open to air as able, antibiotic ointment twice daily. Reviewed if it does not resolve in 2 weeks to call office. Reassurance given.

## 2017-01-18 NOTE — Patient Instructions (Signed)
Foliculitis (Folliculitis) La foliculitis es una inflamacin de los folculos capilares. La foliculitis ocurre con mayor frecuencia en el cuero cabelludo, los muslos, las piernas, la espalda y las nalgas. Sin embargo, puede aparecer en cualquier parte del cuerpo. CAUSAS Esta afeccin puede ser causada por lo siguiente:  Una infeccin bacteriana (frecuente).  Infecciones por hongos.  Infecciones virales.  Contacto con ciertas sustancias qumicas, especialmente aceites y alquitrn.  Rasurarse o depilarse.  Aplicacin frecuente de cremas o ungentos grasosos en la piel. La foliculitis de duracin prolongada y la foliculitis recurrente pueden ser causadas por bacterias que viven en las narinas. FACTORES DE RIESGO Es ms probable que esta afeccin se desarrolle en las personas que tengan lo siguiente:  Un sistema inmunitario debilitado.  Diabetes.  Obesidad. SNTOMAS Los sntomas de esta afeccin incluyen lo siguiente:  Enrojecimiento.  Inflamacin.  Hinchazn.  Picazn.  Pequeos puntos blancos o amarillos llenos de pus (pstulas) que pican y aparecen sobre una zona enrojecida. Si hay una infeccin que se adentra en el folculo, puede formarse un fornculo.  Un conjunto de fornculos agrupados estrechamente (carbunclo). Estos fornculos tienden a formarse en zonas del cuerpo con mucho pelo y sudorosas. DIAGNSTICO Esta afeccin se diagnostica con un examen de la piel. Para hallar la causa de la afeccin, el mdico puede tomar una muestra de uno de los fornculos o pstulas y analizarla. TRATAMIENTO El tratamiento de este trastorno puede incluir lo siguiente:  La aplicacin de compresas calientes en la zona afectada.  La administracin de antibiticos o la aplicacin de un antibitico a la piel.  La aplicacin de una solucin antisptica o darse un bao con esta solucin.  La administracin de un medicamento de venta libre para calmar la picazn.  La realizacin de un  procedimiento para drenar las pstulas o los fornculos. Se puede realizar si una pstula o un fornculo contienen mucho pus o lquido.  La extraccin del pelo con lser. Se puede realizar para tratar una foliculitis de duracin prolongada. INSTRUCCIONES PARA EL CUIDADO EN EL HOGAR  Si se lo indican, aplique calor en la zona afectada tan frecuentemente como se lo haya indicado el mdico. Use la fuente de calor que el mdico le recomiende, como una compresa de calor hmedo o una almohadilla trmica. ? Coloque una toalla entre la piel y la fuente de calor. ? Aplique el calor durante 20 a 30minutos. ? Retire la fuente de calor si la piel se le pone de color rojo brillante. Esto es muy importante si no puede sentir el dolor, el calor o el fro. Puede correr un riesgo mayor de sufrir quemaduras.  Si le recetaron un antibitico, tmelo como se lo haya indicado el mdico. No deje de usar el antibitico aunque comience a sentirse mejor.  Tome los medicamentos de venta libre y los recetados solamente como se lo haya indicado el mdico.  No rasure la piel irritada.  Concurra a todas las visitas de control como se lo haya indicado el mdico. Esto es importante. SOLICITE ATENCIN MDICA DE INMEDIATO SI:  Tiene ms enrojecimiento, hinchazn o dolor en la zona afectada.  Hay lneas rojas que se extienden desde la zona afectada.  Tiene fiebre. Esta informacin no tiene como fin reemplazar el consejo del mdico. Asegrese de hacerle al mdico cualquier pregunta que tenga. Document Released: 02/01/2005 Document Revised: 08/03/2011 Document Reviewed: 11/22/2014 Elsevier Interactive Patient Education  2018 [employer]  

## 2017-01-19 ENCOUNTER — Telehealth: Payer: Self-pay | Admitting: *Deleted

## 2017-01-19 NOTE — Telephone Encounter (Signed)
I gave her a sample of Neosporin ointment that she could pick up at grocery store drugstore over-the-counter fish she would know what she is taking up. There is no prescription given. She had a small hair bump on her perineum, we had told her to soak in a warm tub, loose clothing, open to air is able. Should resolve on its own. She only needs to come back if it continues to be a problem. Sorry for all the confusion. Hopefully she did pick up the after visit summary that had information about folliculitis in Spanish. Thank you for calling.

## 2017-01-19 NOTE — Telephone Encounter (Signed)
Patient informed. 

## 2017-01-19 NOTE — Telephone Encounter (Signed)
Dawn Villegas please see below note.

## 2017-01-19 NOTE — Telephone Encounter (Signed)
-----   Message from [name] sent at 01/18/2017  3:10 PM EST ----- Regarding: Dawn Villegas pt What does she need to come back for? Also she stated [place] was going to send RX but I did not see anything in the system.

## 2017-01-26 IMAGING — MG MM SCREENING BREAST TOMO BILAT
9 series · 9 of 25 positions shown · non-contrast
Comparison: Previous exam(s).

CLINICAL DATA: Screening.

EXAM:
DIGITAL SCREENING BILATERAL MAMMOGRAM WITH 3D TOMO WITH CAD

[R MLO (1 of 2)]
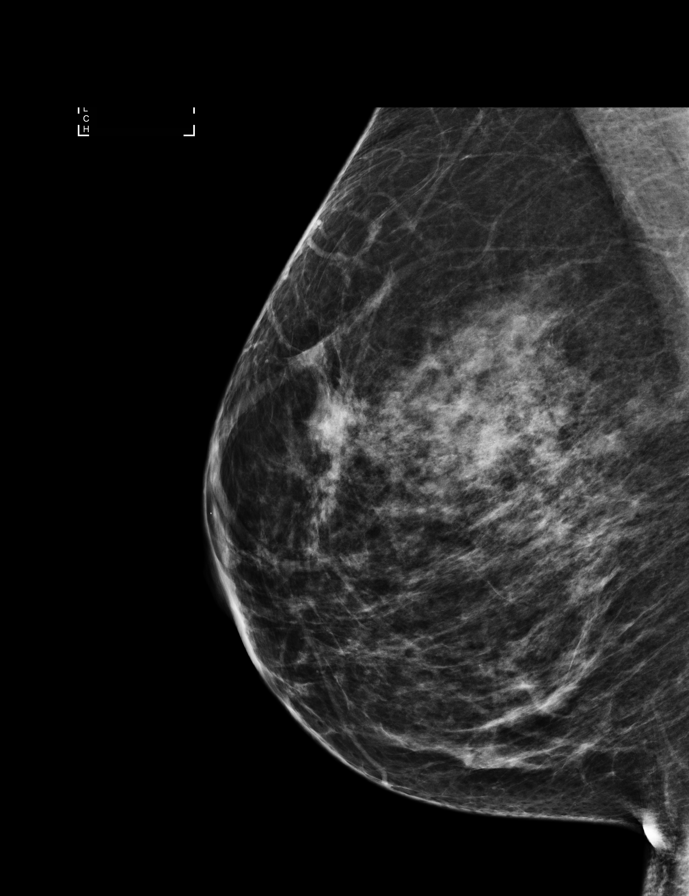

[L CC]
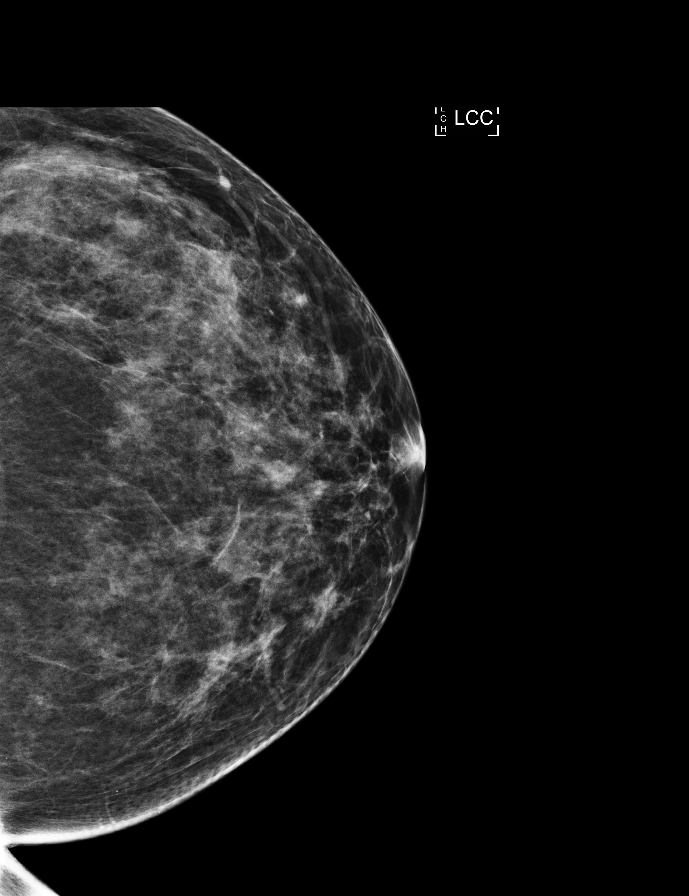

[L MLO]
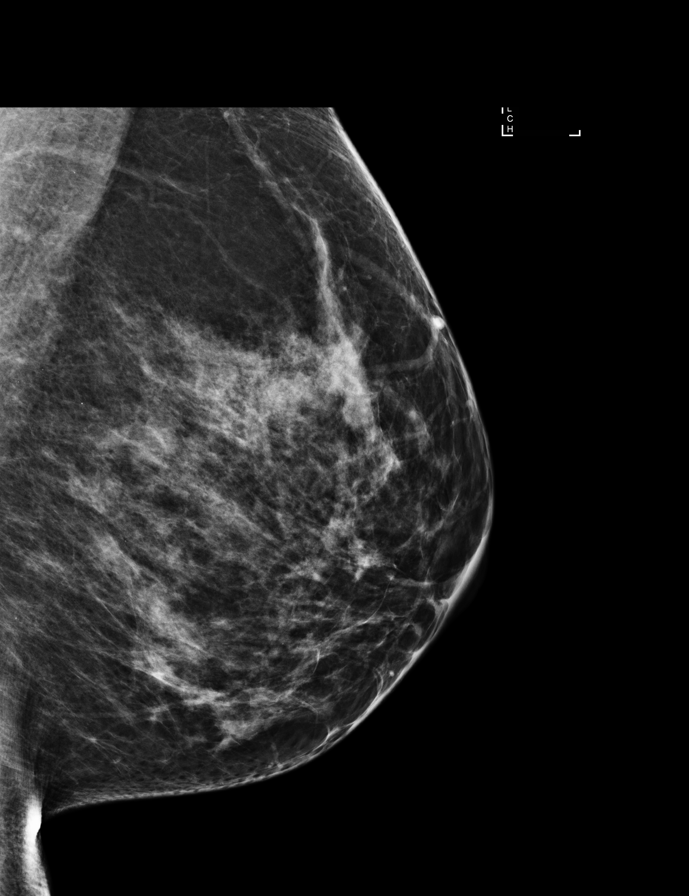

[R MLO (2 of 2)]
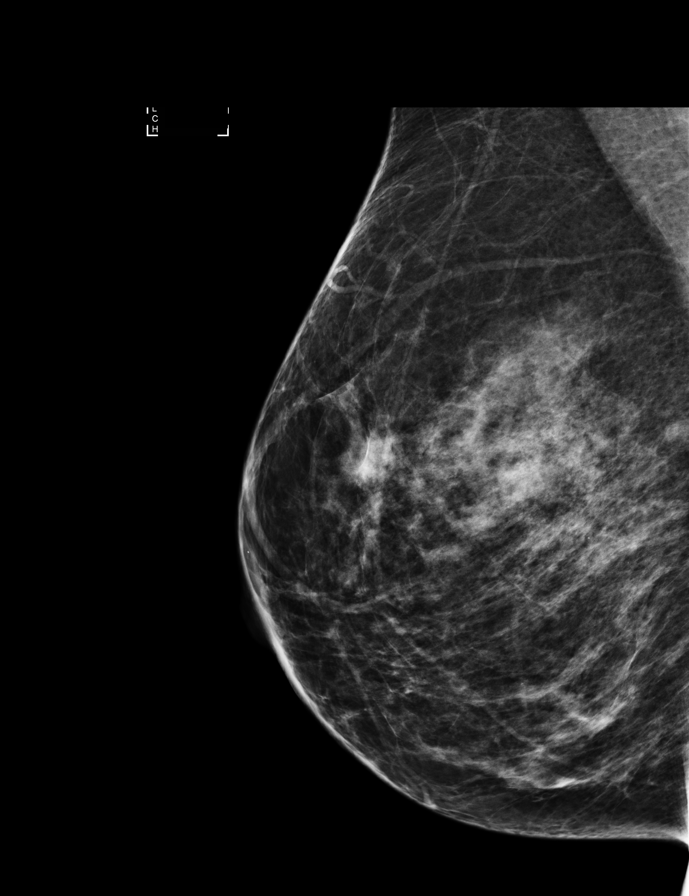

[R CC]
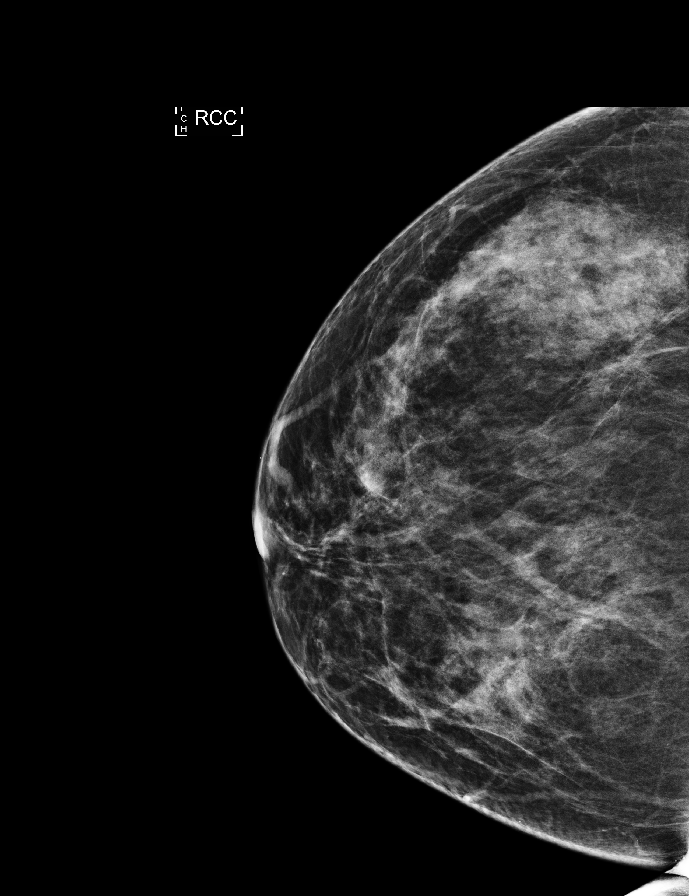

[R CC tomo · tomo slice 41/82.0]
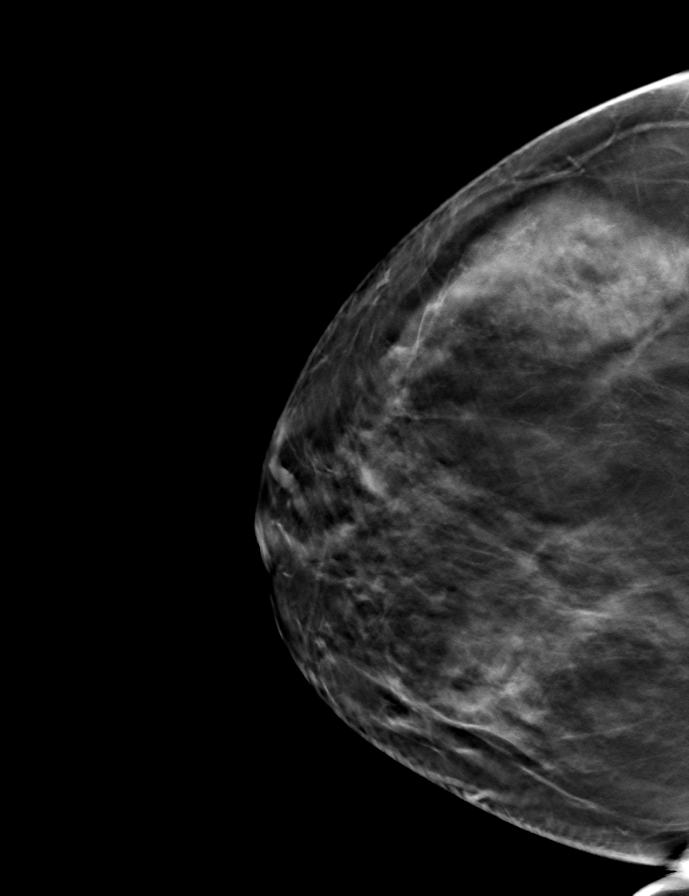

[L CC tomo · tomo slice 41/82.0]
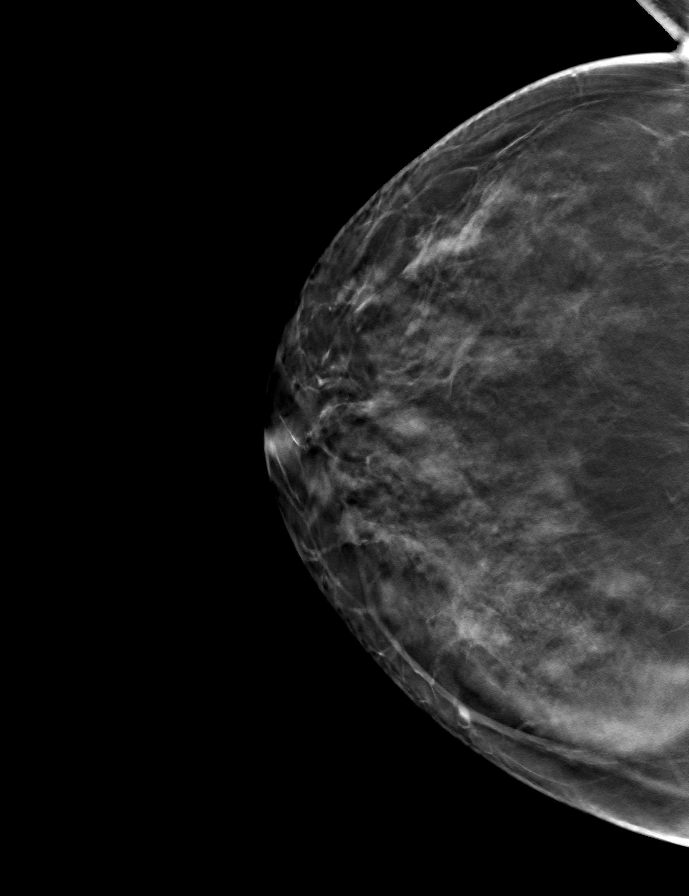

[R MLO tomo · tomo slice 40/79.0]
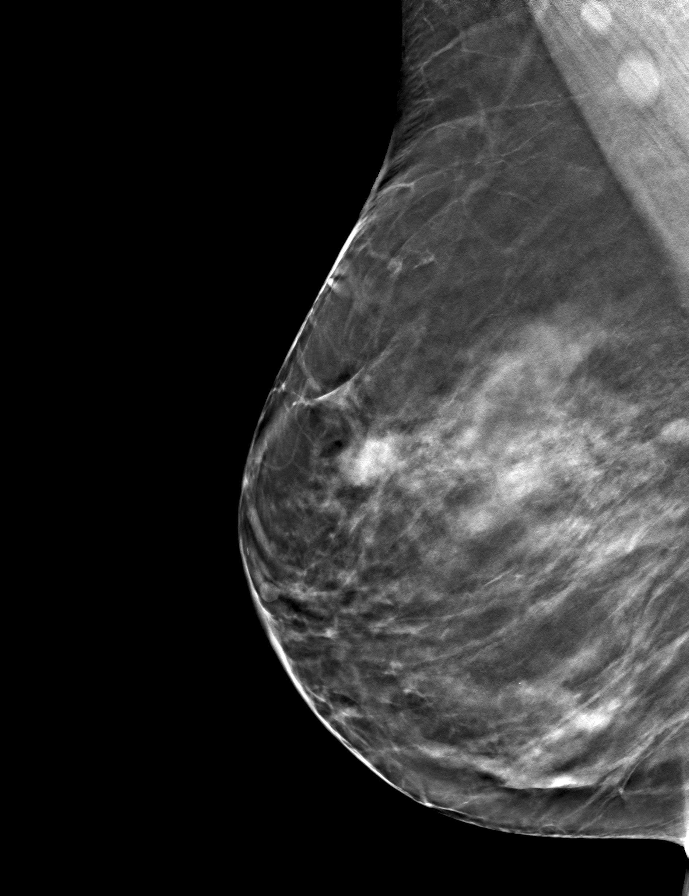

[L MLO tomo · tomo slice 43/84.0]
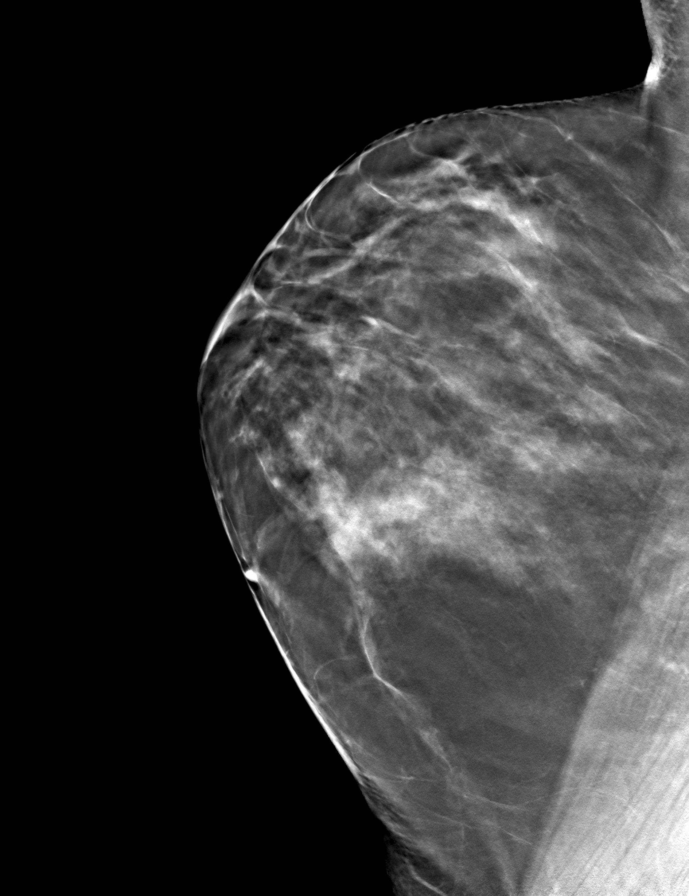

[9 of 25 positions shown; findings below may reference images not displayed]

ACR Breast Density Category c: The breast tissue is heterogeneously
dense, which may obscure small masses.
FINDINGS: There are no findings suspicious for malignancy. Images were
processed with CAD.
IMPRESSION: No mammographic evidence of malignancy. A result letter of this
screening mammogram will be mailed directly to the patient.

RECOMMENDATION:
Screening mammogram in one year. (Code:[id number]-G-1SS)

BI-RADS CATEGORY  1: Negative.

## 2017-02-09 ENCOUNTER — Other Ambulatory Visit: Payer: Self-pay | Admitting: *Deleted

## 2017-02-09 MED ORDER — LEVONORGESTREL-ETHINYL ESTRAD 0.1-20 MG-MCG PO TABS: 1.0000 | ORAL_TABLET | Freq: Every day | ORAL | 0 refills | Status: DC

## 2017-05-27 ENCOUNTER — Other Ambulatory Visit: Payer: Self-pay

## 2017-06-27 ENCOUNTER — Other Ambulatory Visit: Payer: Self-pay

## 2017-09-20 ENCOUNTER — Other Ambulatory Visit: Payer: Self-pay | Admitting: Women's Health

## 2017-11-22 ENCOUNTER — Encounter: Payer: Self-pay | Admitting: Women's Health

## 2017-11-22 ENCOUNTER — Ambulatory Visit (INDEPENDENT_AMBULATORY_CARE_PROVIDER_SITE_OTHER): Payer: 59 | Admitting: Women's Health

## 2017-11-22 VITALS — BP 118/76 | Ht 61.0 in | Wt 160.0 lb

## 2017-11-22 DIAGNOSIS — L659 Nonscarring hair loss, unspecified: Secondary | ICD-10-CM

## 2017-11-22 DIAGNOSIS — Z01419 Encounter for gynecological examination (general) (routine) without abnormal findings: Secondary | ICD-10-CM

## 2017-11-22 DIAGNOSIS — Z13220 Encounter for screening for lipoid disorders: Secondary | ICD-10-CM

## 2017-11-22 MED ORDER — LEVOTHYROXINE SODIUM 50 MCG PO TABS: 50.0000 ug | ORAL_TABLET | Freq: Every day | ORAL | 4 refills | Status: DC

## 2017-11-22 NOTE — Progress Notes (Signed)
Dawn Villegas 10-20-1965 [id number]    History:    Presents for annual exam.  Regular monthly 3 days cycle using no contraception husband prostate cancer with prostatectomy 08/2017.  Doing much better is back to work.  Minimal menopausal symptoms.  Normal Pap and mammogram history overdue for mammogram.  Hypothyroid on Synthroid 50 mcg.  History of elevated cholesterol had been on Lipitor.  Negative colonoscopy 2016.  History of GDM.  Dawn Villegas in-house interpreter.  Past medical history, past surgical history, family history and social history were all reviewed and documented in the EPIC chart.  Works at [employer].  4 children ages 68 through 33.  Parents hypertension, father diabetes.  ROS:  A ROS was performed and pertinent positives and negatives are included.  Exam:  Vitals:   11/22/17 1514  BP: 118/76  Weight: 160 lb (72.6 kg)  Height: 5\' 1"  (1.549 m)   Body mass index is 30.23 kg/m.   General appearance:  Normal Thyroid:  Symmetrical, normal in size, without palpable masses or nodularity. Respiratory  Auscultation:  Clear without wheezing or rhonchi Cardiovascular  Auscultation:  Regular rate, without rubs, murmurs or gallops  Edema/varicosities:  Not grossly evident Abdominal  Soft,nontender, without masses, guarding or rebound.  Liver/spleen:  No organomegaly noted  Hernia:  None appreciated  Skin  Inspection:  Grossly normal   Breasts: Examined lying and sitting.     Right: Without masses, retractions, discharge or axillary adenopathy.     Left: Without masses, retractions, discharge or axillary adenopathy. Gentitourinary   Inguinal/mons:  Normal without inguinal adenopathy  External genitalia:  Normal  BUS/Urethra/Skene's glands:  Normal  Vagina:  Normal  Cervix:  Normal  Uterus:  normal in size, shape and contour.  Midline and mobile  Adnexa/parametria:     Rt: Without masses or tenderness.   Lt: Without masses or tenderness.  Anus and  perineum: Normal  Digital rectal exam: Normal sphincter tone without palpated masses or tenderness  Assessment/Plan:  52 y.o. M HF G5, P4 for annual exam with no complaints.  Monthly cycle/husband prostatectomy Overweight Hypothyroid  Plan: Synthroid 50 mcg p.o. daily prescription, proper use given and reviewed.  SBE's, reviewed importance of annual screening mammogram, breast center information given instructed to schedule.  Calcium rich foods, vitamin D 2000 daily encouraged.  Menopause reviewed.  Reviewed importance of increasing regular weightbearing type exercise, less than 20 g saturated fat daily, decreasing calorie/carbs,  gained 11 pounds in the past year.  Discussed Gardasil  for children.  CBC, CMP, lipid panel, Pap normal 2017, new screening guidelines reviewed.   [place], 3:59 PM 11/22/2017

## 2017-11-22 NOTE — Patient Instructions (Addendum)
[phone]  Breast center  Call for mammogram  Human Papillomavirus Quadrivalent Vaccine suspension for injection Qu es este medicamento? La [employer] CONTRA EL VIRUS DEL PAPILOMA HUMANO es una vacuna. Se utiliza para prevenir infecciones de cuatro tipos de virus del papiloma humano. En mujeres, la vacuna puede disminuir su riesgo de desarrollar cncer cervical, vaginal, vulvar, o anal y verrugas genitales. En hombres, la vacuna puede disminuir su riesgo de verrugas genitales y cncer anal. No puede contraer estas enfermedades de esta vacuna. Este medicamento no trata [employer]. Este medicamento puede ser utilizado para otros usos; si tiene alguna pregunta consulte con su proveedor de atencin mdica o con su farmacutico. MARCAS COMUNES: Gardasil Qu le debo informar a mi profesional de la salud antes de tomar este medicamento? Necesita saber si usted presenta alguno de los siguientes problemas o situaciones: -fiebre o infeccin -hemofilia -infeccin por VIH o SIDA -problemas del sistema inmunolgico -conteos bajos de plaquetas -una reaccin alrgica o inusual a la vacuna contra el virus del papiloma humano, a la levadura, a otros medicamentos, alimentos, colorantes o conservantes -si est embarazada o buscando quedar embarazada -si est amamantando a un beb Cmo debo utilizar este medicamento? Esta vacuna se inyecta en el msculo en la parte superior del brazo o en el muslo. La administra un profesional de KB Home	[place]. Debe ser supervisado por 15 minutos despus de recibir cada dosis. A veces, puede desmayarse despus de recibir la vacuna. Es posible que le pidan que se siente o se acueste durante los 15 minutos. Se administran tres dosis. La segunda dosis se administra 2 meses de recibir la primera dosis. La ltima dosis se administra 4 meses despus de recibir la segunda dosis. Recibir una copia de informacin escrita sobre la vacuna antes de cada vacuna. Asegrese de leer este folleto  cada vez cuidadosamente. Este folleto puede cambiar con frecuencia. Hable con su pediatra para informarse acerca del uso de este medicamento en nios. Aunque este medicamento ha sido recetado a nios tan menores como de 9 aos de edad para condiciones selectivas, las precauciones se aplican. Sobredosis: Pngase en contacto inmediatamente con un centro toxicolgico o una sala de urgencia si usted cree que haya tomado demasiado medicamento. ATENCIN: Con[employer] Foods es solo para usted. No comparta este medicamento con nadie. Qu sucede si me olvido de una dosis? Todas las 3 dosis de esta vacuna deben ser administradas dentro de 6 meses. Recuerde de mantener todas las citas para las dosis siguientes. Su proveedor de Museum/gallery curator cuando necesita volver para su prxima dosis. Consulte a su profesional de la salud por asesoramiento si no puede asistir a una cita o si se olvida una dosis programada. Qu puede interactuar con este medicamento? -otras vacunas Puede ser que esta lista no menciona todas las posibles interacciones. Informe a su profesional de KB Home	[place] de [employer] Corporation productos a base de hierbas, medicamentos de [place] o suplementos nutritivos que est tomando. Si usted fuma, consume bebidas alcohlicas o si utiliza drogas ilegales, indqueselo tambin a su profesional de KB Home	[place]. Algunas sustancias pueden interactuar con su medicamento. A qu debo estar atento al usar Coca[employer]? Es posible que esta vacuna no proteja completamente a todos. Contine a realizarse exmenes plvicos y del cncer cervical o anal de Geographical information systems officer regular como le haya indicado su mdico. El virus del papiloma humano es una enfermedad de transmisin sexual. Se puede pasar por cualquier actividad sexual que consiste de contacto genital. La vacuna acta mejor cuando se administra antes  de tener contacto con el virus. La [employer] de las personas que tienen el virus no muestran signos ni sntomas  ningunos. Si presenta una reaccin o sntoma inusual despus de recibir la vacuna, informe a su mdico o su profesional de KB Home	[place]. Qu efectos secundarios puedo tener al [employer] este medicamento? Efectos secundarios que debe informar a su mdico o a Barrister's clerk de la salud tan pronto como sea posible: -Chief of Staff como erupcin cutnea, picazn o urticarias, hinchazn de la cara, labios o lengua -problemas respiratorios -sensacin de desmayos o cadas Efectos secundarios que, por lo general, no requieren atencin mdica (debe informarlos a su mdico o a su profesional de la salud si persisten o si son molestos): -tos -mareos -fiebre -dolor de cabeza -nusea -enrojecimiento, calor, hinchazn, dolor o picazn en el lugar de la inyeccin Puede ser que esta lista no menciona todos los posibles efectos secundarios. Comunquese a su mdico por asesoramiento mdico Humana Inc. Usted puede informar los efectos secundarios a la FDA por telfono al 1-800-FDA-1088. Dnde debo guardar mi medicina? Este medicamento se administra en hospitales o clnicas y no necesitar guardarlo en su domicilio. ATENCIN: Este folleto es un resumen. Puede ser que no cubra toda la posible informacin. Si usted tiene preguntas acerca de esta medicina, consulte con su mdico, su farmacutico o su profesional de Technical sales engineer.  2018 Elsevier/Gold Standard (2014-03-27 00:00:00)  Vit d 2000  iu daily  Mantenimiento de la salud - Mujeres (Health Maintenance, Female) Un estilo de vida saludable y los cuidados preventivos pueden favorecer considerablemente a la salud y Musician. Pregunte a su mdico cul es el cronograma de exmenes peridicos apropiado para usted. Esta es una buena oportunidad para consultarlo sobre cmo prevenir enfermedades y [place] sano. Adems de los controles, hay muchas otras cosas que puede hacer usted mismo. Los expertos han realizado numerosas investigaciones  [employer] cambios en el estilo de vida y las medidas de prevencin que, [place], lo ayudarn a mantenerse sano. Solicite a su mdico ms informacin. EL PESO Y LA DIETA Consuma una dieta saludable.  Asegrese de [employer] Stores verduras, frutas, productos lcteos de bajo contenido de [place] y Advertising account planner.  No consuma muchos alimentos de alto contenido de grasas slidas, azcares agregados o sal.  Realice actividad fsica con regularidad. Esta es una de las prcticas ms importantes que puede hacer por su salud. ? La Delorise Shiner de los adultos deben hacer ejercicio durante al menos 154mnutos por semana. El ejercicio debe aumentar la frecuencia cardaca y pActorla transpiracin (ejercicio de iLoret[place]. ? La mayora de los adultos tambin deben hacer ejercicios de elongacin al mToysRusveces a la semana. Agregue esto al su plan de ejercicio de intensidad moderada. Mantenga un peso saludable.  El ndice de masa corporal ([facility] es una medida que puede utilizarse para identificar posibles problemas de pBurchard Proporciona una estimacin de la grasa corporal basndose en el peso y la altura. Su mdico puede ayudarle a dRadiation protection practitionerIKaliday a lScientist, forensico mTheatre managerun peso saludable.  Para las mujeres de 20aos o ms: ? Un IPiedmont Medical Centermenor de 18,5 se considera bajo peso. ? Un ILittleton Regional Healthcareentre 18,5 y 24,9 es normal. ? Un I[facility] Incentre 25 y 29,9 se considera sobrepeso. ? Un IMC de 30 o ms se considera obesidad. Observe los niveles de colesterol y lpidos en la sangre.  Debe comenzar a rEnglish as a second language teacherde lpidos y cResearch officer, trade unionen la sangre a los 20aos y luego repetirlos cada 551aos  Es posible que Automotive engineer los niveles de colesterol con mayor frecuencia si: ? Sus niveles de lpidos y colesterol son altos. ? Es mayor de [id number]. ? Presenta un alto riesgo de padecer enfermedades cardacas. DETECCIN DE CNCER Cncer de pulmn  Se recomienda realizar exmenes de deteccin de cncer de  pulmn a personas adultas entre 55 y 53 aos que estn en riesgo de Horticulturist, commercial de pulmn por sus antecedentes de consumo de tabaco.  Se recomienda una tomografa computarizada de baja dosis de los pulmones todos los aos a las personas que: ? Fuman actualmente. ? Hayan dejado el hbito en algn momento en los ltimos 15aos. ? Hayan fumado durante 30aos un paquete diario. Un paquete-ao equivale a fumar un promedio de un paquete de cigarrillos diario durante un ao.  Los exmenes de deteccin anuales deben continuar hasta que hayan pasado 15aos desde que dej de fumar.  Ya no debern realizarse si tiene un problema de salud que le impida recibir tratamiento para Science writer de pulmn. Cncer de mama  Practique la autoconciencia de la mama. Esto significa reconocer la apariencia normal de sus mamas y cmo las siente.  Tambin significa realizar autoexmenes regulares de [employer]. Informe a su mdico sobre cualquier cambio, sin importar cun pequeo sea.  Si tiene entre 20 y 42 aos, un mdico debe realizarle un examen clnico de las mamas como parte del examen regular de [place], cada 1 a 3aos.  Si tiene 40aos o ms, debe Information systems manager clnico de las Microsoft. Tambin considere realizarse una [place] todos los [place].  Si tiene antecedentes familiares de cncer de mama, hable con su mdico para someterse a un estudio gentico.  Si tiene alto riesgo de Chief Financial Officer de mama, hable con su mdico para someterse a Public house manager y [employer].  La evaluacin del gen del cncer de mama (BRCA) se recomienda a mujeres que tengan familiares con cnceres relacionados con el BRCA. Los cnceres relacionados con el BRCA incluyen los siguientes: ? [place]. ? Ovario. ? Trompas. ? Cnceres de peritoneo.  Los resultados de la evaluacin determinarn la necesidad de asesoramiento gentico y de [place] de BRCA1 y  BRCA2. Cncer de cuello del tero El mdico puede recomendarle que se haga pruebas peridicas de deteccin de cncer de los rganos de la pelvis (ovarios, tero y vagina). Estas pruebas incluyen un examen plvico, que abarca controlar si se produjeron cambios microscpicos en la superficie del cuello del tero (prueba de Papanicolaou). Pueden recomendarle que se haga estas pruebas cada 3aos, a partir de los 21aos.  A las mujeres que tienen entre 30 y 41aos, los mdicos pueden recomendarles que se sometan a exmenes plvicos y pruebas de Papanicolaou cada 9aos, o a la prueba de Papanicolaou y el examen plvico en combinacin con estudios de deteccin del virus del papiloma humano (VPH) cada 5aos. Algunos tipos de VPH aumentan el riesgo de Chief Financial Officer de cuello del tero. La prueba para la deteccin del VPH tambin puede realizarse a mujeres de cualquier edad cuyos resultados de la prueba de Papanicolaou no sean claros.  Es posible que otros mdicos no recomienden exmenes de deteccin a mujeres no embarazadas que se consideran sujetos de bajo riesgo de Chief Financial Officer de pelvis y que no tienen sntomas. Pregntele al mdico si un examen plvico de deteccin es adecuado para usted.  Si ha recibido un tratamiento para Science writer cervical o una enfermedad que podra causar  cncer, necesitar realizarse una prueba de Papanicolaou y controles durante al menos 4 aos de concluido el [place]. Si no se ha hecho el Papanicolaou con regularidad, debern volver a evaluarse los factores de riesgo (como tener un nuevo compaero sexual), para Teacher, adult education si debe realizarse los estudios nuevamente. Algunas mujeres sufren problemas mdicos que aumentan la probabilidad de Museum/gallery curator cncer de cuello del tero. En estos casos, el mdico podr QUALCOMM se realicen controles y pruebas de Papanicolaou con ms frecuencia. Cncer colorrectal  Este tipo de cncer puede detectarse y a menudo prevenirse.  Por lo  general, los estudios de rutina se deben Medical laboratory scientific officer a Field seismologist a Proofreader de los 63 aos y [place] 49 aos.  Sin embargo, el mdico podr aconsejarle que lo haga antes, si tiene factores de riesgo para el cncer de colon.  Tambin puede recomendarle que use un kit de prueba para Hydrologist en la materia fecal.  Es posible que se use una pequea cmara en el extremo de un tubo para examinar directamente el colon (sigmoidoscopia o colonoscopia) a fin de Hydrographic surveyor formas tempranas de cncer colorrectal.  Los exmenes de rutina generalmente comienzan a los 36aos.  El examen directo del colon se debe repetir cada 5 a 10aos hasta los 75aos. Sin embargo, es posible que se realicen exmenes con mayor frecuencia, si se detectan formas tempranas de plipos precancerosos o pequeos bultos. Cncer de piel  Revise la piel de la cabeza a los pies con regularidad.  Informe a su mdico si aparecen nuevos lunares o los que tiene se modifican, especialmente en su forma y color.  Tambin notifique al mdico si tiene un lunar que es ms grande que el tamao de una goma de lpiz.  Siempre use pantalla solar. Aplique pantalla solar de Kerry Dory y repetida a lo largo del Training and development officer.  Protjase usando mangas y [employer], un sombrero de ala ancha y gafas para el sol, siempre que se encuentre en el exterior. ENFERMEDADES CARDACAS, DIABETES E HIPERTENSIN ARTERIAL  La hipertensin arterial causa enfermedades cardacas y [place] el riesgo de ictus. La hipertensin arterial es ms probable en los siguientes casos: ? Las personas que tienen la presin arterial en el extremo del rango normal (100-139/85-89 mm Hg). ? [employer] Corporation con sobrepeso u obesidad. ? Scientist, water quality.  Si usted tiene entre 18 y 39 aos, debe medirse la presin arterial cada 3 a 5 aos. Si usted tiene 40 aos o ms, debe medirse la presin arterial [employer]lett[employer]. Debe medirse la presin arterial dos veces: una vez  cuando est en un hospital o una clnica y la otra vez cuando est en otro sitio. Registre el promedio de [employer]. Para controlar su presin arterial cuando no est en un hospital o [place], puede usar lo siguiente: ? Jorje Guild automtica para medir la presin arterial en una farmacia. ? Un monitor para medir la presin arterial en el hogar.  Si tiene entre 47 y 74 aos, consulte a su mdico si debe tomar aspirina para prevenir el ictus.  Realcese exmenes de deteccin de la diabetes con regularidad. Esto incluye la toma de [place] de sangre para controlar el nivel de azcar en la sangre durante el [place]. ? Si tiene un peso normal y un bajo riesgo de padecer diabetes, realcese este anlisis cada tres aos despus de los 45aos. ? Si tiene sobrepeso y un alto riesgo de padecer diabetes, considere someterse a este anlisis antes o con mayor frecuencia.  PREVENCIN DE INFECCIONES HepatitisB  Si tiene un riesgo ms alto de Museum/gallery curator hepatitis B, debe someterse a un examen de deteccin de este virus. Se considera que tiene un alto riesgo de contraer hepatitis B si: ? Naci en un pas donde la hepatitis B es frecuente. Pregntele a su mdico qu pases son considerados de Public affairs consultant. ? Sus padres nacieron en un pas de alto riesgo y usted no recibi una vacuna que lo proteja contra la hepatitis B (vacuna contra la hepatitis B). ? [place]. ? [place] agujas para inyectarse drogas. ? Vive con alguien que tiene hepatitis B. ? Ha tenido sexo con alguien que tiene hepatitis B. ? Recibe tratamiento de hemodilisis. ? Toma ciertos medicamentos para el cncer, trasplante de rganos y afecciones autoinmunitarias. Hepatitis C  Se recomienda un anlisis de [place] para: ? [employer] 1945 y 1965. ? Todas las personas que tengan un riesgo de haber contrado hepatitis C. Enfermedades de transmisin sexual (ETS).  Debe realizarse pruebas de deteccin de  enfermedades de transmisin sexual (ETS), incluidas gonorrea y clamidia si: ? Es sexualmente activo y es menor de [id number]. ? Es mayor de 24aos, y Investment banker, operational informa que corre riesgo de tener este tipo de infecciones. ? La actividad sexual ha cambiado desde que le hicieron la ltima prueba de deteccin y tiene un riesgo mayor de Best boy clamidia o Radio broadcast assistant. Pregntele al mdico si usted tiene riesgo.  Si no tiene el VIH, pero corre riesgo de infectarse por el virus, se recomienda tomar diariamente un medicamento recetado para evitar la infeccin. Esto se conoce como profilaxis previa a la exposicin. Se considera que est en riesgo si: ? Es [place] sexualmente y no [place] preservativos habitualmente o no conoce el estado del VIH de sus Advertising copywriter. ? Se inyecta drogas. ? Es [place] sexualmente con Ardelia Mems pareja que tiene VIH. Consulte a su mdico para saber si tiene un alto riesgo de infectarse por el VIH. Si opta por comenzar la profilaxis previa a la exposicin, primero debe realizarse anlisis de deteccin del VIH. Luego, le harn anlisis cada 28mses mientras est tomando los medicamentos para la profilaxis previa a la exposicin. [facility] Si es premenopusica y puede quedar eNew Riegel solicite a su mdico asesoramiento previo a la concepcin.  Si puede quedar embarazada, tome 400 a [id number](mcg) de cido fAnheuser-Busch  Si desea evitar el embarazo, hable con su mdico sobre el control de la natalidad (anticoncepcin). OSTEOPOROSIS Y MENOPAUSIA  La osteoporosis es una enfermedad en la que los huesos pierden los minerales y la fuerza por el avance de la edad. El resultado pueden ser fracturas graves en los hE[place] El riesgo de osteoporosis puede identificarse con uArdelia Memsprueba de densidad sea.  Si tiene 65aos o ms, o si est en riesgo de sufrir osteoporosis y fracturas, pregunte a su mdico si debe someterse a exmenes.  Consulte a su mdico si debe tomar un suplemento de  calcio o de vitamina D para reducir el riesgo de osteoporosis.  La menopausia puede presentar ciertos sntomas fsicos y rGaffer  La terapia de reemplazo hormonal puede reducir algunos de estos sntomas y rGaffer Consulte a su mdico para saber si la terapia de reemplazo hormonal es conveniente para usted. INSTRUCCIONES PARA EL CUIDADO EN EL HOGAR  Realcese los estudios de rutina de la salud, dentales y de lPublic librarian  MBelpre  No consuma ningn producto que contenga tabaco, lo que  incluye cigarrillos, tabaco de mascar o cigarrillos electrnicos.  Si est embarazada, no beba alcohol.  Si est amamantando, reduzca el consumo de alcohol y la frecuencia con la que consume.  Si es mujer y no est embarazada limite el consumo de alcohol a no ms de 1 medida por da. Una medida equivale a 12onzas de cerveza, 5onzas de vino o 1onzas de bebidas alcohlicas de alta graduacin.  No consuma drogas.  No comparta agujas.  Solicite ayuda a su mdico si necesita apoyo o informacin para abandonar las drogas.  Informe a su mdico si a menudo se siente deprimido.  Notifique a su mdico si alguna vez ha sido vctima de abuso o si no se siente seguro en su hogar. Esta informacin no tiene Marine scientist el consejo del mdico. Asegrese de hacerle al mdico cualquier pregunta que tenga. Document Released: 01/21/2011 Document Revised: 02/22/2014 Document Reviewed: 11/05/2014 Elsevier Interactive Patient Education  [employer].

## 2017-12-28 ENCOUNTER — Other Ambulatory Visit: Payer: Self-pay | Admitting: Obstetrics & Gynecology

## 2017-12-28 DIAGNOSIS — Z1231 Encounter for screening mammogram for malignant neoplasm of breast: Secondary | ICD-10-CM

## 2018-02-09 ENCOUNTER — Ambulatory Visit
Admission: RE | Admit: 2018-02-09 | Discharge: 2018-02-09 | Disposition: A | Discharge status: Home / Self Care | Payer: 59 | Source: Ambulatory Visit | Attending: Obstetrics & Gynecology | Admitting: Obstetrics & Gynecology

## 2018-02-09 DIAGNOSIS — Z1231 Encounter for screening mammogram for malignant neoplasm of breast: Secondary | ICD-10-CM

## 2018-06-15 ENCOUNTER — Other Ambulatory Visit: Payer: Self-pay

## 2018-06-19 ENCOUNTER — Encounter: Payer: Self-pay | Admitting: Obstetrics & Gynecology

## 2018-06-19 ENCOUNTER — Other Ambulatory Visit: Payer: Self-pay

## 2018-06-19 ENCOUNTER — Ambulatory Visit (INDEPENDENT_AMBULATORY_CARE_PROVIDER_SITE_OTHER): Payer: 59 | Admitting: Obstetrics & Gynecology

## 2018-06-19 VITALS — BP 122/78 | Ht 61.0 in | Wt 156.0 lb

## 2018-06-19 DIAGNOSIS — R350 Frequency of micturition: Secondary | ICD-10-CM | POA: Diagnosis not present

## 2018-06-19 DIAGNOSIS — Z124 Encounter for screening for malignant neoplasm of cervix: Secondary | ICD-10-CM | POA: Diagnosis not present

## 2018-06-19 DIAGNOSIS — N841 Polyp of cervix uteri: Secondary | ICD-10-CM | POA: Diagnosis not present

## 2018-06-19 DIAGNOSIS — N949 Unspecified condition associated with female genital organs and menstrual cycle: Secondary | ICD-10-CM

## 2018-06-19 LAB — WET PREP FOR TRICH, YEAST, CLUE

## 2018-06-19 MED ORDER — FLUCONAZOLE 150 MG PO TABS: 150.0000 mg | ORAL_TABLET | Freq: Every day | ORAL | 5 refills | Status: AC

## 2018-06-19 NOTE — Patient Instructions (Signed)
1. 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  Currently asymptomatic with a negative wet prep.  Will send a treatment for fluconazole 150 mg 1 tablet daily for 3 days to be taken before her manses when she is symptomatic.  Usage reviewed and prescription sent to pharmacy.  Refills sent. - WET PREP FOR [place], YEAST, CLUE  2. Urinary frequency Very mild urinary frequency late at night, no recent change, no other UTI symptoms.  Urine analysis completely negative.  Patient reassured. - Urinalysis,Complete w/RFL Culture  3. Screening for malignant neoplasm of cervix Last Pap test negative more than 3 years ago.  Pap reflex done today. - Pap IG w/ reflex to HPV when ASC-U  4. Endocervical polyp Endocervical polyp present on exam.  Easily excised and sent to pathology.  No complication, procedure well tolerated by patient.  Other orders - iron polysaccharides (NU-IRON) 150 MG capsule; Take 150 mg by mouth daily. - fluconazole (DIFLUCAN) 150 MG tablet; Take 1 tablet (150 mg total) by mouth daily for 3 days.  Rosemarie Ax, fue un placer verle hoy!  Voy a informarle de sus [employer].

## 2018-06-19 NOTE — Progress Notes (Signed)
Dawn Villegas Dec 30, 1965 [id number]        53 y.o.  [id number] Married.  Husband had Prostate Cancer s/p Prostatectomy   RP: Vaginal burning before each menstrual period x almost a year  HPI: Menstrual periods every month with normal flow.  No breakthrough bleeding.  No pelvic pain.  Patient complains of recurring vaginal burning and increased vaginal discharge every month just prior to her periods, the symptoms resolve without treatment after her period.  Some urinary frequency especially at night.  No fever.  Abstinent because of her husband's prostatectomy.  Declines STI screening.  Normal bowel movements.  Last Pap test January 2017 was negative.  Last complete annual gynecologic exam October 2019.   OB History  Gravida Para Term Preterm AB Living  5 4     1 4   SAB TAB Ectopic Multiple Live Births  1            # Outcome Date GA Lbr Len/2nd Weight Sex Delivery Anes PTL Lv  5 SAB           4 Para           3 Para           2 Para           1 Para             Past medical history,surgical history, problem list, medications, allergies, family history and social history were all reviewed and documented in the EPIC chart.   Directed ROS with pertinent positives and negatives documented in the history of present illness/assessment and plan.  Exam:  Vitals:   06/19/18 0837  BP: 122/78  Weight: 156 lb (70.8 kg)  Height: 5\' 1"  (1.549 m)   General appearance:  Normal  Abdomen: Normal, no suprapubic tenderness  CVAT negative bilaterally  Gynecologic exam: Vulva normal.  Speculum: Cervix with an endocervical polyp.  Vagina normal.  No blood.  Normal secretions.  Pap reflex done.  Wet prep done.  Easy removal of the endocervical polyp by rotation with a clamp.  Specimen sent to pathology.  Silver nitrate to control bleeding at the base of the polyp.  Bimanual exam: Uterus anteverted, normal volume, nontender, mobile.  No adnexal mass, nontender bilaterally.  Wet prep negative  U/A completely negative   Assessment/Plan:  53 y.o. [id number]   1. 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  Currently asymptomatic with a negative wet prep.  Will send a treatment for fluconazole 150 mg 1 tablet daily for 3 days to be taken before her manses when she is symptomatic.  Usage reviewed and prescription sent to pharmacy.  Refills sent. - WET PREP FOR [place], YEAST, CLUE  2. Urinary frequency Very mild urinary frequency late at night, no recent change, no other UTI symptoms.  Urine analysis completely negative.  Patient reassured. - Urinalysis,Complete w/RFL Culture  3. Screening for malignant neoplasm of cervix Last Pap test negative more than 3 years ago.  Pap reflex done today. - Pap IG w/ reflex to HPV when ASC-U  4. Endocervical polyp Endocervical polyp present on exam.  Easily excised and sent to pathology.  No complication, procedure well tolerated by patient.  Other orders - iron polysaccharides (NU-IRON) 150 MG capsule; Take 150 mg by mouth daily. - fluconazole (DIFLUCAN) 150 MG tablet; Take 1 tablet (150 mg total) by mouth daily for 3 days.  Counseling on above  issues and coordination of care more than 50% for 25 minutes.  [name] MD, 8:57 AM 06/19/2018

## 2018-06-20 LAB — URINALYSIS, COMPLETE W/RFL CULTURE
Bacteria, UA: NONE SEEN /HPF
Bilirubin Urine: NEGATIVE
Glucose, UA: NEGATIVE
Hgb urine dipstick: NEGATIVE
Hyaline Cast: NONE SEEN /LPF
Ketones, ur: NEGATIVE
Leukocyte Esterase: NEGATIVE
Nitrites, Initial: NEGATIVE
Protein, ur: NEGATIVE
RBC / HPF: NONE SEEN /HPF (ref 0–2)
Specific Gravity, Urine: 1.025 (ref 1.001–1.03)
WBC, UA: NONE SEEN /HPF (ref 0–5)
pH: 5.5 (ref 5.0–8.0)

## 2018-06-20 LAB — PAP IG W/ RFLX HPV ASCU

## 2018-06-20 LAB — NO CULTURE INDICATED

## 2018-06-21 LAB — TISSUE SPECIMEN

## 2018-06-21 LAB — PATHOLOGY REPORT

## 2019-03-27 ENCOUNTER — Other Ambulatory Visit: Payer: Self-pay | Admitting: Internal Medicine

## 2019-03-27 DIAGNOSIS — Z1231 Encounter for screening mammogram for malignant neoplasm of breast: Secondary | ICD-10-CM

## 2019-05-04 ENCOUNTER — Ambulatory Visit
Admission: RE | Admit: 2019-05-04 | Discharge: 2019-05-04 | Disposition: A | Discharge status: Home / Self Care | Payer: 59 | Source: Ambulatory Visit | Attending: Internal Medicine | Admitting: Internal Medicine

## 2019-05-04 ENCOUNTER — Other Ambulatory Visit: Payer: Self-pay

## 2019-05-04 DIAGNOSIS — Z1231 Encounter for screening mammogram for malignant neoplasm of breast: Secondary | ICD-10-CM

## 2019-06-19 ENCOUNTER — Other Ambulatory Visit: Payer: Self-pay

## 2019-06-20 ENCOUNTER — Encounter: Payer: 59 | Admitting: Obstetrics & Gynecology

## 2019-08-01 ENCOUNTER — Other Ambulatory Visit: Payer: Self-pay

## 2019-08-01 ENCOUNTER — Encounter: Payer: Self-pay | Admitting: Obstetrics & Gynecology

## 2019-08-01 ENCOUNTER — Ambulatory Visit (INDEPENDENT_AMBULATORY_CARE_PROVIDER_SITE_OTHER): Payer: 59 | Admitting: Obstetrics & Gynecology

## 2019-08-01 VITALS — BP 124/78 | Ht 60.5 in | Wt 155.0 lb

## 2019-08-01 DIAGNOSIS — Z113 Encounter for screening for infections with a predominantly sexual mode of transmission: Secondary | ICD-10-CM

## 2019-08-01 DIAGNOSIS — N920 Excessive and frequent menstruation with regular cycle: Secondary | ICD-10-CM | POA: Diagnosis not present

## 2019-08-01 DIAGNOSIS — E663 Overweight: Secondary | ICD-10-CM

## 2019-08-01 DIAGNOSIS — Z01419 Encounter for gynecological examination (general) (routine) without abnormal findings: Secondary | ICD-10-CM

## 2019-08-01 DIAGNOSIS — N898 Other specified noninflammatory disorders of vagina: Secondary | ICD-10-CM | POA: Diagnosis not present

## 2019-08-01 DIAGNOSIS — Z1151 Encounter for screening for human papillomavirus (HPV): Secondary | ICD-10-CM | POA: Diagnosis not present

## 2019-08-01 LAB — WET PREP FOR TRICH, YEAST, CLUE

## 2019-08-01 NOTE — Patient Instructions (Signed)
1. Encounter for routine gynecological examination with Papanicolaou smear of cervix Normal gynecologic exam.  Pap test with high-risk HPV done today.  Breast exam normal.  Screening mammogram negative March 2021.  Colonoscopy 2016.  Health labs with family physician.  2. Menorrhagia with regular cycle Heavier menses lasting longer at 6 to 7 days.  We will complete the investigation with a pelvic ultrasound to evaluate the endometrial lining.  History of endometrial polyp. - US Transvaginal Non-OB; Future  3. Vaginal discharge Vaginal discharge with negative wet prep.  Pending gonorrhea and chlamydia. - WET PREP FOR [place], YEAST, CLUE  4. Overweight (BMI 25.0-29.9) Recommend a lower calorie/carb diet.  Aerobic activities 5 times a week and light weightlifting every 2 days.  Other orders - doxylamine, Sleep, (UNISOM) 25 MG tablet; Take 25 mg by mouth at bedtime as needed. - cholecalciferol (VITAMIN D3) 25 MCG (1000 UNIT) tablet; Take 1,000 Units by mouth daily. - COLLAGEN PO; Take by mouth.  Aamari, it was a pleasure seeing you today!  I will inform you of your results as soon as they are available.

## 2019-08-01 NOTE — Progress Notes (Signed)
Dawn Villegas 07-22-1965 [id number]   History:    54 y.o. [id number] Married.  Husband had Prostate Cancer s/p Prostatectomy   RP: Vaginal burning before each menstrual period x almost a year  HPI: Menstrual periods every month with heavier flow x 6-7 days.  No breakthrough bleeding.  No pelvic pain.  Husband had prostatectomy, then penile prosthesis.  Increased vaginal discharge.  Will do STI screening.  Normal bowel movements.  Normal urine.  Breasts normal.  BMI 29.77.  Colono 2016.  Health labs Fam MD.   Past medical history,surgical history, family history and social history were all reviewed and documented in the EPIC chart.  Gynecologic History Patient's last menstrual period was 07/17/2019.  Obstetric History OB History  Gravida Para Term Preterm AB Living  5 4     1 4   SAB TAB Ectopic Multiple Live Births  1            # Outcome Date GA Lbr Len/2nd Weight Sex Delivery Anes PTL Lv  5 SAB           4 Para           3 Para           2 Para           1 Para              ROS: A ROS was performed and pertinent positives and negatives are included in the history.  GENERAL: No fevers or chills. HEENT: No change in vision, no earache, sore throat or sinus congestion. NECK: No pain or stiffness. CARDIOVASCULAR: No chest pain or pressure. No palpitations. PULMONARY: No shortness of breath, cough or wheeze. GASTROINTESTINAL: No abdominal pain, nausea, vomiting or diarrhea, melena or bright red blood per rectum. GENITOURINARY: No urinary frequency, urgency, hesitancy or dysuria. MUSCULOSKELETAL: No joint or muscle pain, no back pain, no recent trauma. DERMATOLOGIC: No rash, no itching, no lesions. ENDOCRINE: No polyuria, polydipsia, no heat or cold intolerance. No recent change in weight. HEMATOLOGICAL: No anemia or easy bruising or bleeding. NEUROLOGIC: No headache, seizures, numbness, tingling or weakness. PSYCHIATRIC: No depression, no loss of interest in normal activity or  change in sleep pattern.     Exam:   BP 124/78    Ht 5' 0.5" (1.537 m)    Wt 155 lb (70.3 kg)    LMP 07/17/2019    BMI 29.77 kg/m   Body mass index is 29.77 kg/m.  General appearance : Well developed well nourished female. No acute distress HEENT: Eyes: no retinal hemorrhage or exudates,  Neck supple, trachea midline, no carotid bruits, no thyroidmegaly Lungs: Clear to auscultation, no rhonchi or wheezes, or rib retractions  Heart: Regular rate and rhythm, no murmurs or gallops Breast:Examined in sitting and supine position were symmetrical in appearance, no palpable masses or tenderness,  no skin retraction, no nipple inversion, no nipple discharge, no skin discoloration, no axillary or supraclavicular lymphadenopathy Abdomen: no palpable masses or tenderness, no rebound or guarding Extremities: no edema or skin discoloration or tenderness  Pelvic: Vulva: Normal             Vagina: No gross lesions.  Increased discharge.  Wet prep done  Cervix: No gross lesions or discharge.  Pap/HPV HR, Gono-Chlam.  Uterus  AV, normal size, shape and consistency, non-tender and mobile  Adnexa  Without masses or tenderness  Anus: Normal  Wet prep negative   Assessment/Plan:  54 y.o. female for annual exam  1. Encounter for routine gynecological examination with Papanicolaou smear of cervix Normal gynecologic exam.  Pap test with high-risk HPV done today.  Breast exam normal.  Screening mammogram - March 2021.  Colonoscopy 2016.  Health labs with family physician.  2. Menorrhagia with regular cycle Heavier menses lasting longer at 6 to 7 days.  We will complete the investigation with a pelvic ultrasound to evaluate the endometrial lining.  History of endometrial polyp. - US Transvaginal Non-OB; Future  3. Vaginal discharge Vaginal discharge with negative wet prep.  Pending gonorrhea and chlamydia. - WET PREP FOR [place], YEAST, CLUE  4. Overweight (BMI 25.0-29.9) Recommend a lower  calorie/carb diet.  Aerobic activities 5 times a week and light weightlifting every 2 days.  Other orders - doxylamine, Sleep, (UNISOM) 25 MG tablet; Take 25 mg by mouth at bedtime as needed. - cholecalciferol (VITAMIN D3) 25 MCG (1000 UNIT) tablet; Take 1,000 Units by mouth daily. - COLLAGEN PO; Take by mouth.  [name] MD, 4:14 PM 08/01/2019

## 2019-08-03 LAB — PAP IG, CT-NG NAA, HPV HIGH-RISK
C. trachomatis RNA, TMA: NOT DETECTED
HPV DNA High Risk: NOT DETECTED
N. gonorrhoeae RNA, TMA: NOT DETECTED

## 2019-08-30 ENCOUNTER — Ambulatory Visit: Payer: 59 | Admitting: Obstetrics & Gynecology

## 2019-08-30 ENCOUNTER — Other Ambulatory Visit: Payer: 59

## 2021-01-19 ENCOUNTER — Other Ambulatory Visit: Payer: Self-pay

## 2021-01-19 ENCOUNTER — Encounter: Payer: Self-pay | Admitting: Obstetrics & Gynecology

## 2021-01-19 ENCOUNTER — Ambulatory Visit (INDEPENDENT_AMBULATORY_CARE_PROVIDER_SITE_OTHER): Payer: 59 | Admitting: Obstetrics & Gynecology

## 2021-01-19 VITALS — BP 124/80

## 2021-01-19 DIAGNOSIS — N920 Excessive and frequent menstruation with regular cycle: Secondary | ICD-10-CM | POA: Diagnosis not present

## 2021-01-19 NOTE — Progress Notes (Signed)
Dawn Villegas [birth year] [id number]   History:    55 y.o. [id number] Married.  Husband with Prostate Ca s/p Prostatectomy  RP:  Vaginal bleeding x 2 weeks   HPI: Menstrual periods every month with heavier flow x 6-7 days usually.  Skipped her period in 07/2020, then July period was heavier.  Last period started 01/01/2021 and lasted 2 weeks with moderate flow.  No breakthrough bleeding.  No pelvic pain currently.  Husband had prostatectomy, not sexually active.  Normal bowel movements. Normal urine.  Health labs Fam MD Dr Karle Starch:  Hb 15.2 in 11/2020.  TSH normal 05/2020.  Past medical history,surgical history, family history and social history were all reviewed and documented in the EPIC chart.  Gynecologic History Patient's last menstrual period was 01/01/2021.  Obstetric History OB History  Gravida Para Term Preterm AB Living  5 4     1 4   SAB IAB Ectopic Multiple Live Births  1            # Outcome Date GA Lbr Len/2nd Weight Sex Delivery Anes PTL Lv  5 SAB           4 Para           3 Para           2 Para           1 Para              ROS: A ROS was performed and pertinent positives and negatives are included in the history.  GENERAL: No fevers or chills. HEENT: No change in vision, no earache, sore throat or sinus congestion. NECK: No pain or stiffness. CARDIOVASCULAR: No chest pain or pressure. No palpitations. PULMONARY: No shortness of breath, cough or wheeze. GASTROINTESTINAL: No abdominal pain, nausea, vomiting or diarrhea, melena or bright red blood per rectum. GENITOURINARY: No urinary frequency, urgency, hesitancy or dysuria. MUSCULOSKELETAL: No joint or muscle pain, no back pain, no recent trauma. DERMATOLOGIC: No rash, no itching, no lesions. ENDOCRINE: No polyuria, polydipsia, no heat or cold intolerance. No recent change in weight. HEMATOLOGICAL: No anemia or easy bruising or bleeding. NEUROLOGIC: No headache, seizures, numbness, tingling or weakness. PSYCHIATRIC:  No depression, no loss of interest in normal activity or change in sleep pattern.     Exam:   BP 124/80   LMP 01/01/2021   There is no height or weight on file to calculate BMI.  General appearance : Well developed well nourished female. No acute distress HEENT: Eyes: no retinal hemorrhage or exudates,  Neck supple, trachea midline, no carotid bruits, no thyroidmegaly Lungs: Clear to auscultation, no rhonchi or wheezes, or rib retractions  Heart: Regular rate and rhythm, no murmurs or gallops Breast:Examined in sitting and supine position were symmetrical in appearance, no palpable masses or tenderness,  no skin retraction, no nipple inversion, no nipple discharge, no skin discoloration, no axillary or supraclavicular lymphadenopathy Abdomen: no palpable masses or tenderness, no rebound or guarding Extremities: no edema or skin discoloration or tenderness  Pelvic: Vulva: Normal             Vagina: No gross lesions or discharge  Cervix: No gross lesions or discharge.  No blood.  Uterus  AV, normal size, shape and consistency, non-tender and mobile  Adnexa  Without masses or tenderness  Anus: Normal   Assessment/Plan:  55 y.o. female for annual exam   1. Menorrhagia with regular cycle Menstrual periods every month with heavier flow x 6-7  days usually.  Skipped her period in 07/2020, then July period was heavier.  Last period started 01/01/2021 and lasted 2 weeks with moderate flow.  No breakthrough bleeding.  No pelvic pain currently. Health labs Fam MD Dr Karle Starch:  Hb 15.2 in 11/2020.  TSH normal 05/2020.  Normal gynecologic exam today.  No current vaginal/uterine bleeding.  Will further investigate with a Pelvic US at f/u.  Will possibly control her cycle with the Progestin pill per Pelvic US findings. - US Transvaginal Non-OB; Future  Other orders - ferrous sulfate 325 (65 FE) MG tablet; Take by mouth. - atorvastatin (LIPITOR) 40 MG tablet; Take 40 mg by mouth daily.   [name] MD, 4:23 PM 01/19/2021

## 2021-03-05 ENCOUNTER — Ambulatory Visit (INDEPENDENT_AMBULATORY_CARE_PROVIDER_SITE_OTHER): Payer: 59 | Admitting: Obstetrics & Gynecology

## 2021-03-05 ENCOUNTER — Encounter: Payer: Self-pay | Admitting: Obstetrics & Gynecology

## 2021-03-05 ENCOUNTER — Other Ambulatory Visit: Payer: Self-pay

## 2021-03-05 ENCOUNTER — Ambulatory Visit (INDEPENDENT_AMBULATORY_CARE_PROVIDER_SITE_OTHER): Payer: 59

## 2021-03-05 VITALS — BP 110/78

## 2021-03-05 DIAGNOSIS — N920 Excessive and frequent menstruation with regular cycle: Secondary | ICD-10-CM

## 2021-03-05 DIAGNOSIS — R6889 Other general symptoms and signs: Secondary | ICD-10-CM | POA: Diagnosis not present

## 2021-03-05 DIAGNOSIS — N951 Menopausal and female climacteric states: Secondary | ICD-10-CM

## 2021-03-05 NOTE — Progress Notes (Signed)
Dawn Villegas [birth year] [id number]        56 y.o.  [id number]   RP: Menorrhagia for Pelvic US  HPI:  Menstrual periods every month with heavier flow x 6-7 days usually.  Last period started 01/01/2021 and lasted 2 weeks with moderate flow.  No breakthrough bleeding.  No menses since 12/2020.  No pelvic pain currently.  Husband had prostatectomy, not sexually active.  Normal bowel movements. Normal urine.  Hypothyroidism on Synthroid.  C/O episodes of feeling very cold and weight gain.  Seeing her Fam MD, Dr Karle Starch, next week, recommend a TSH.   OB History  Gravida Para Term Preterm AB Living  5 4     1 4   SAB IAB Ectopic Multiple Live Births  1            # Outcome Date GA Lbr Len/2nd Weight Sex Delivery Anes PTL Lv  5 SAB           4 Para           3 Para           2 Para           1 Para             Past medical history,surgical history, problem list, medications, allergies, family history and social history were all reviewed and documented in the EPIC chart.   Directed ROS with pertinent positives and negatives documented in the history of present illness/assessment and plan.  Exam:  Vitals:   03/05/21 1555  BP: 110/78   General appearance:  Normal  Pelvic US today: T/V images.  Anteverted uterus normal in size and shape with multiple intramural and subserosal fibroids seen, the largest is anterior measuring 1.8 x 1.9 cm.  The overall uterine size is measured at 8.53 x 5.2 x 5.43 cm.  The endometrial lining is thin and symmetrical measured at 4.2 mm.  No obvious mass or thickening seen.  Both ovaries are atrophic in appearance with no follicles seen bilaterally.  Positive perfusion to both ovaries.  No adnexal mass.  No free fluid in the pelvis.   Assessment/Plan:  56 y.o. [id number]   1. Menorrhagia with regular cycle Menstrual periods every month with heavier flow x 6-7 days usually.  Last period started 01/01/2021 and lasted 2 weeks with moderate flow.  No  breakthrough bleeding.  No menses since 12/2020.  No pelvic pain currently.  Husband had prostatectomy, not sexually active.  Normal bowel movements. Normal urine.  Hypothyroidism on Synthroid.  C/O episodes of feeling very cold and weight gain.  Seeing her Fam MD, Dr Karle Starch, next week, recommend a TSH.  Pelvic ultrasound findings thoroughly reviewed with patient.  Patient reassured that her endometrial lining is thin at 4.2 mm.  Decision to observe as patient is perimenopausal.  2. Perimenopause Patient is 56 with last menstrual period November 2022.  Her endometrial lining is now thin at 4.2 mm.  Patient is probably perimenopausal.  Decision to observe patient's menstrual pattern.  If menorrhagia recurs, patient will make an appointment to readdress the management plan.  3. Sensation of feeling cold Hypothyroidism on levothyroxine.  Patient frequently has a feeling of being cold.  Possibly needs a higher dosage of levothyroxine.  Appointment scheduled with family doctor, Dr. Karle Starch, next week, recommend to perform a TSH at that time.  Other orders - levothyroxine (SYNTHROID) 75 MCG tablet; TOME UNA TABLETA TODOS LOS D AS - fluticasone (FLONASE) 50 MCG/ACT nasal  spray; Place into both nostrils.   [name] MD, 4:05 PM 03/05/2021

## 2021-03-13 ENCOUNTER — Encounter: Payer: Self-pay | Admitting: Obstetrics & Gynecology

## 2021-04-16 ENCOUNTER — Encounter: Payer: Self-pay | Admitting: Obstetrics & Gynecology

## 2021-04-16 ENCOUNTER — Other Ambulatory Visit: Payer: Self-pay

## 2021-04-16 ENCOUNTER — Ambulatory Visit (INDEPENDENT_AMBULATORY_CARE_PROVIDER_SITE_OTHER): Payer: 59 | Admitting: Obstetrics & Gynecology

## 2021-04-16 VITALS — BP 110/70 | HR 70 | Resp 16 | Ht 60.25 in | Wt 154.0 lb

## 2021-04-16 DIAGNOSIS — Z01419 Encounter for gynecological examination (general) (routine) without abnormal findings: Secondary | ICD-10-CM | POA: Diagnosis not present

## 2021-04-16 DIAGNOSIS — E663 Overweight: Secondary | ICD-10-CM | POA: Diagnosis not present

## 2021-04-16 DIAGNOSIS — N951 Menopausal and female climacteric states: Secondary | ICD-10-CM | POA: Diagnosis not present

## 2021-04-16 NOTE — Progress Notes (Signed)
? ? ?Dawn Villegas [birth year] [id number] ? ? ?History:    56 y.o. [id number] Married.  Husband had Prostate Cancer s/p Prostatectomy  ?  ?RP: Vaginal burning before each menstrual period x almost a year ?  ?HPI: Had heavy menses monthly until LMP 04/06/2020 which was light. No breakthrough bleeding.  No pelvic pain.  Husband had prostatectomy, then penile prosthesis.  Pap Neg in 07/2019. Normal bowel movements.  Normal urine.  Breasts normal. Mammo 06/2020, Rt Dx mammo/US Benign.  BMI 29.83.  Colono 2016.  Health labs Fam MD. ? ?Past medical history,surgical history, family history and social history were all reviewed and documented in the EPIC chart. ? ?Gynecologic History ?Patient's last menstrual period was 04/06/2021. ? ?Obstetric History ?OB History  ?Gravida Para Term Preterm AB Living  ?5 4     1 4   ?SAB IAB Ectopic Multiple Live Births  ?1          ?  ?# Outcome Date GA Lbr Len/2nd Weight Sex Delivery Anes PTL Lv  ?5 SAB           ?4 Para           ?3 Para           ?2 Para           ?1 Para           ? ? ? ?ROS: A ROS was performed and pertinent positives and negatives are included in the history. ? GENERAL: No fevers or chills. HEENT: No change in vision, no earache, sore throat or sinus congestion. NECK: No pain or stiffness. CARDIOVASCULAR: No chest pain or pressure. No palpitations. PULMONARY: No shortness of breath, cough or wheeze. GASTROINTESTINAL: No abdominal pain, nausea, vomiting or diarrhea, melena or bright red blood per rectum. GENITOURINARY: No urinary frequency, urgency, hesitancy or dysuria. MUSCULOSKELETAL: No joint or muscle pain, no back pain, no recent trauma. DERMATOLOGIC: No rash, no itching, no lesions. ENDOCRINE: No polyuria, polydipsia, no heat or cold intolerance. No recent change in weight. HEMATOLOGICAL: No anemia or easy bruising or bleeding. NEUROLOGIC: No headache, seizures, numbness, tingling or weakness. PSYCHIATRIC: No depression, no loss of interest in normal activity or  change in sleep pattern.  ?  ? ?Exam: ? ? ?BP 110/70   Pulse 70   Resp 16   Ht 5' 0.25" (1.53 m)   Wt 154 lb (69.9 kg)   LMP 04/06/2021 Comment: spotting on 04-06-21 for 2 days  BMI 29.83 kg/m?  ? ?Body mass index is 29.83 kg/m?. ? ?General appearance : Well developed well nourished female. No acute distress ?HEENT: Eyes: no retinal hemorrhage or exudates,  Neck supple, trachea midline, no carotid bruits, no thyroidmegaly ?Lungs: Clear to auscultation, no rhonchi or wheezes, or rib retractions  ?Heart: Regular rate and rhythm, no murmurs or gallops ?Breast:Examined in sitting and supine position were symmetrical in appearance, no palpable masses or tenderness,  no skin retraction, no nipple inversion, no nipple discharge, no skin discoloration, no axillary or supraclavicular lymphadenopathy ?Abdomen: no palpable masses or tenderness, no rebound or guarding ?Extremities: no edema or skin discoloration or tenderness ? ?Pelvic: Vulva: Normal ?            Vagina: No gross lesions or discharge ? Cervix: No gross lesions or discharge ? Uterus  AV, normal size, shape and consistency, non-tender and mobile ? Adnexa  Without masses or tenderness ? Anus: Normal ? ?Pelvic US 03/05/2021: T/V images.  Anteverted uterus normal in size  and shape with multiple intramural and subserosal fibroids seen, the largest is anterior measuring 1.8 x 1.9 cm.  The overall uterine size is measured at 8.53 x 5.2 x 5.43 cm.  The endometrial lining is thin and symmetrical measured at 4.2 mm.  No obvious mass or thickening seen.  Both ovaries are atrophic in appearance with no follicles seen bilaterally.  Positive perfusion to both ovaries.  No adnexal mass.  No free fluid in the pelvis. ? ? ?Assessment/Plan:  56 y.o. female for annual exam  ? ?1. Well female exam with routine gynecological exam ?Had heavy menses monthly until LMP 04/06/2020 which was light.  No breakthrough bleeding.  No pelvic pain.  Husband had prostatectomy, then penile  prosthesis.  Pap Neg in 07/2019.  Normal bowel movements.  Normal urine. Breasts normal. Mammo 06/2020, Rt Dx mammo/US Benign.  BMI 29.83.  Colono 2016.  Health labs Fam MD. ? ?2. Perimenopause ?Changing menses, LMP 04/06/2020 was light.  Pelvic US 03/05/2021 showed a thin endometrial line. ? ?3. Overweight (BMI 25.0-29.9)  ?Lower calorie/carb diet.  Increase fitness activities. ? ?[name] MD, 3:35 PM 04/16/2021 ? ?  ?

## 2021-09-04 ENCOUNTER — Ambulatory Visit (INDEPENDENT_AMBULATORY_CARE_PROVIDER_SITE_OTHER): Payer: 59 | Admitting: Obstetrics & Gynecology

## 2021-09-04 ENCOUNTER — Encounter: Payer: Self-pay | Admitting: Obstetrics & Gynecology

## 2021-09-04 VITALS — BP 120/72 | HR 70 | Resp 16

## 2021-09-04 DIAGNOSIS — N898 Other specified noninflammatory disorders of vagina: Secondary | ICD-10-CM

## 2021-09-04 LAB — WET PREP FOR TRICH, YEAST, CLUE

## 2021-09-04 MED ORDER — FLUCONAZOLE 150 MG PO TABS: 150.0000 mg | ORAL_TABLET | ORAL | 1 refills | Status: AC

## 2021-09-04 MED ORDER — TINIDAZOLE 500 MG PO TABS: 1000.0000 mg | ORAL_TABLET | Freq: Two times a day (BID) | ORAL | 0 refills | Status: AC

## 2021-09-04 NOTE — Progress Notes (Signed)
    Dawn Villegas October 01, 1965 [id number]        56 y.o.  [id number]  Married.  Husband had Prostate Cancer s/p Prostatectomy.  RP: Vaginal irritation x a few days  HPI: C/O vaginal irritation with a mild discharge and itching.  Just started her period yesterday.  Menses every month with normal flow.  No BTB.  No pelvic pain.  Urine normal.   OB History  Gravida Para Term Preterm AB Living  '5 4 4   1 4  '$ SAB IAB Ectopic Multiple Live Births  1            # Outcome Date GA Lbr Len/2nd Weight Sex Delivery Anes PTL Lv  5 SAB           4 Term           3 Term           2 Term           1 Term             Past medical history,surgical history, problem list, medications, allergies, family history and social history were all reviewed and documented in the EPIC chart.   Directed ROS with pertinent positives and negatives documented in the history of present illness/assessment and plan.  Exam:  Vitals:   09/04/21 1058  BP: 120/72  Pulse: 70  Resp: 16   General appearance:  Normal  Gynecologic exam: Vulva normal.  Speculum:  Cervix/vagina normal.  Mild dark menstrual blood.  Wet prep done.  Wet prep:  Yeasts and Clue cells with odor   Assessment/Plan:  56 y.o. [id number]   1. Vaginal irritation C/O vaginal irritation with a mild discharge and itching.  Just started her period yesterday.  Menses every month with normal flow.  No BTB.  No pelvic pain.  Urine normal.  Cervix/vagina normal.  Mild dark menstrual blood.  Wet prep done.  Confirmed Bacterial Vaginosis and Yeast vaginitis on Wet prep.  Patient informed.  Will treat with Tinidazole, then Fluconazole.  Usage reviewed and prescriptions sent to pharmacy. - WET PREP FOR [place], YEAST, CLUE  Other orders - traZODone (DESYREL) 50 MG tablet; Take 50 mg by mouth at bedtime. - tinidazole (TINDAMAX) 500 MG tablet; Take 2 tablets (1,000 mg total) by mouth 2 (two) times daily for 2 days. - fluconazole (DIFLUCAN) 150 MG tablet; Take 1  tablet (150 mg total) by mouth every other day for 3 doses.   [name] MD, 11:24 AM 09/04/2021

## 2022-11-18 ENCOUNTER — Other Ambulatory Visit (HOSPITAL_COMMUNITY)
Admission: RE | Admit: 2022-11-18 | Discharge: 2022-11-18 | Disposition: A | Discharge status: Home / Self Care | Payer: 59 | Source: Ambulatory Visit

## 2022-11-18 ENCOUNTER — Encounter: Payer: Self-pay | Admitting: Obstetrics and Gynecology

## 2022-11-18 ENCOUNTER — Ambulatory Visit (INDEPENDENT_AMBULATORY_CARE_PROVIDER_SITE_OTHER): Payer: 59 | Admitting: Obstetrics and Gynecology

## 2022-11-18 ENCOUNTER — Other Ambulatory Visit (HOSPITAL_COMMUNITY)
Admission: RE | Admit: 2022-11-18 | Discharge: 2022-11-18 | Disposition: A | Discharge status: Home / Self Care | Payer: 59 | Source: Ambulatory Visit | Attending: Obstetrics and Gynecology | Admitting: Obstetrics and Gynecology

## 2022-11-18 VITALS — BP 114/70 | HR 73 | Ht 60.25 in | Wt 161.0 lb

## 2022-11-18 DIAGNOSIS — Z01419 Encounter for gynecological examination (general) (routine) without abnormal findings: Secondary | ICD-10-CM

## 2022-11-18 DIAGNOSIS — N950 Postmenopausal bleeding: Secondary | ICD-10-CM | POA: Diagnosis present

## 2022-11-18 DIAGNOSIS — Z01411 Encounter for gynecological examination (general) (routine) with abnormal findings: Secondary | ICD-10-CM

## 2022-11-18 DIAGNOSIS — E2839 Other primary ovarian failure: Secondary | ICD-10-CM

## 2022-11-18 DIAGNOSIS — N951 Menopausal and female climacteric states: Secondary | ICD-10-CM

## 2022-11-18 DIAGNOSIS — Z1231 Encounter for screening mammogram for malignant neoplasm of breast: Secondary | ICD-10-CM

## 2022-11-18 NOTE — Progress Notes (Signed)
57 y.o. y.o. female here for annual exam. Patient stopped her period for a year and had anemia with her periods.  Recently started having heavy bleeding and then daily light bleeding.    Patient's last menstrual period was 09/12/2022 (exact date). Period Pattern: (!) Irregular  NO HRT  Blood pressure 114/70, pulse 73, height 5' 0.25" (1.53 m), weight 161 lb (73 kg), last menstrual period 09/12/2022, SpO2 99%.  No results found for: "DIAGPAP", "HPVHIGH", "ADEQPAP"  GYN HISTORY: No results found for: "DIAGPAP", "HPVHIGH", "ADEQPAP"  OB History  Gravida Para Term Preterm AB Living  5 4 4   1 4   SAB IAB Ectopic Multiple Live Births  1            # Outcome Date GA Lbr Len/2nd Weight Sex Type Anes PTL Lv  5 SAB           4 Term           3 Term           2 Term           1 Term             Past Medical History:  Diagnosis Date   Hyperlipidemia    Hypothyroidism    Insomnia    SVD (spontaneous vaginal delivery)    x 4    Past Surgical History:  Procedure Laterality Date   DILATATION & CURETTAGE/HYSTEROSCOPY WITH MYOSURE N/A 03/22/2014   Procedure: DILATATION & CURETTAGE/HYSTEROSCOPY WITH MYOSURE-RESECTOSCOPIC POLYPECTOMY;  Surgeon: Ok Edwards, MD;  Location: [facility];  Service: Gynecology;  Laterality: N/A;   DILATION AND CURETTAGE OF UTERUS     x 1 mab    Current Outpatient Medications on File Prior to Visit  Medication Sig Dispense Refill   atorvastatin (LIPITOR) 40 MG tablet Take 40 mg by mouth daily.     BIOTIN PO Take by mouth.     cholecalciferol (VITAMIN D3) 25 MCG (1000 UNIT) tablet Take 1,000 Units by mouth daily.     levothyroxine (SYNTHROID) 50 MCG tablet Take 50 mcg by mouth daily before breakfast.     COLLAGEN PO Take by mouth. (Patient not taking: Reported on 11/18/2022)     No current facility-administered medications on file prior to visit.    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  Tobacco Use   Smoking status: Never   Smokeless tobacco: Never  Vaping Use   Vaping status: Never Used  Substance and Sexual Activity   Alcohol use: Not Currently   Drug use: No   Sexual activity: Yes    Partners: Male    Birth control/protection: Other-see comments    Comment: 1st intercourse-17, huband prostate removed (prostate cancer)  Other Topics Concern   Not on file  Social History Narrative   Not on file   Social Determinants of Health   Financial Resource Strain: Not on file  Food Insecurity: Not on file  Transportation Needs: Not on file  Physical Activity: Not on file  Stress: Not on file  Social Connections: Not on file  Intimate Partner Violence: Not on file    Family History  Problem Relation Age of Onset   Hypertension Mother    Hypertension Father  Diabetes Father    Heart disease Father      No Known Allergies    Patient's last menstrual period was Patient's last menstrual period was 09/12/2022 (exact date)..             Review of Systems Alls systems reviewed and are negative.     Physical Exam Constitutional:      Appearance: Normal appearance.  Genitourinary:     Vulva and urethral meatus normal.     No lesions in the vagina.     Right Labia: No rash, lesions or skin changes.    Left Labia: No lesions, skin changes or rash.    Vaginal bleeding present.     No vaginal discharge or tenderness.     No vaginal prolapse present.    No vaginal atrophy present.     Right Adnexa: not tender, not palpable and no mass present.    Left Adnexa: not tender, not palpable and no mass present.    No cervical motion tenderness or discharge.        Uterus is enlarged (10cm, likely adenomyosis) and tender.     Uterus is not irregular.     Uterus is anteverted.  Breasts:    Right: Normal.      Left: Normal.  HENT:     Head: Normocephalic.  Neck:     Thyroid: No thyroid mass, thyromegaly or thyroid tenderness.  Cardiovascular:     Rate and Rhythm: Normal rate and regular rhythm.     Heart sounds: Normal heart sounds, S1 normal and S2 normal.  Pulmonary:     Effort: Pulmonary effort is normal.     Breath sounds: Normal breath sounds and air entry.  Abdominal:     General: There is no distension.     Palpations: Abdomen is soft. There is no mass.     Tenderness: There is no abdominal tenderness. There is no guarding or rebound.  Musculoskeletal:        General: Normal range of motion.     Cervical back: Full passive range of motion without pain, normal range of motion and neck supple. No tenderness.     Right lower leg: No edema.     Left lower leg: No edema.  Neurological:     Mental Status: She is alert.  Skin:    General: Skin is warm.  Psychiatric:        Mood and Affect: Mood normal.        Behavior: Behavior normal.        Thought Content: Thought content normal.  Vitals and nursing note reviewed. Exam conducted with a chaperone present.     PROCEDURE: EMB Consent obtained for the procedure.  A bivalve speculum was placed in the vagina.  The cervix was grasped with a single tooth tenaculum.  Uterus sound to 9cm. Pipelle was inserted and rotated.  Adequate specimen was obtained and sent to pathology.  All instruments were removed.  Patient tolerated the procedure well.  To notify patient of the results.    A:         Well Woman GYN exam, PM bleeding                             P:        Pap smear collected today Encouraged annual mammogram screening Colon cancer screening up-to-date DXA ordered today for baseline Labs and immunizations with her primary EMB  collected  Counseled on concern for endometrial cancer or hyperplastic cells with PM bleeding.  To get TV US and return to discuss results Encouraged Vit D and Calcium  Earley Favor

## 2022-11-20 LAB — SURESWAB® ADVANCED VAGINITIS PLUS,TMA
C. trachomatis RNA, TMA: NOT DETECTED
CANDIDA SPECIES: NOT DETECTED
Candida glabrata: NOT DETECTED
N. gonorrhoeae RNA, TMA: NOT DETECTED
SURESWAB(R) ADV BACTERIAL VAGINOSIS(BV),TMA: NEGATIVE
TRICHOMONAS VAGINALIS (TV),TMA: NOT DETECTED

## 2022-11-22 LAB — COMPREHENSIVE METABOLIC PANEL
AG Ratio: 1.5 (calc) (ref 1.0–2.5)
ALT: 58 U/L — ABNORMAL HIGH (ref 6–29)
AST: 37 U/L — ABNORMAL HIGH (ref 10–35)
Albumin: 4.7 g/dL (ref 3.6–5.1)
Alkaline phosphatase (APISO): 95 U/L (ref 37–153)
BUN: 15 mg/dL (ref 7–25)
CO2: 29 mmol/L (ref 20–32)
Calcium: 9.9 mg/dL (ref 8.6–10.4)
Chloride: 100 mmol/L (ref 98–110)
Creat: 0.64 mg/dL (ref 0.50–1.03)
Globulin: 3.1 g/dL (ref 1.9–3.7)
Glucose, Bld: 99 mg/dL (ref 65–99)
Potassium: 3.9 mmol/L (ref 3.5–5.3)
Sodium: 141 mmol/L (ref 135–146)
Total Bilirubin: 0.5 mg/dL (ref 0.2–1.2)
Total Protein: 7.8 g/dL (ref 6.1–8.1)

## 2022-11-22 LAB — CBC
HCT: 47.7 % — ABNORMAL HIGH (ref 35.0–45.0)
Hemoglobin: 15.9 g/dL — ABNORMAL HIGH (ref 11.7–15.5)
MCH: 30.6 pg (ref 27.0–33.0)
MCHC: 33.3 g/dL (ref 32.0–36.0)
MCV: 91.9 fL (ref 80.0–100.0)
MPV: 12.1 fL (ref 7.5–12.5)
Platelets: 290 10*3/uL (ref 140–400)
RBC: 5.19 10*6/uL — ABNORMAL HIGH (ref 3.80–5.10)
RDW: 11.9 % (ref 11.0–15.0)
WBC: 8.4 10*3/uL (ref 3.8–10.8)

## 2022-11-22 LAB — HEPATITIS C ANTIBODY: Hepatitis C Ab: NONREACTIVE

## 2022-11-22 LAB — VITAMIN D 1,25 DIHYDROXY
Vitamin D 1, 25 (OH)2 Total: 42 pg/mL (ref 18–72)
Vitamin D2 1, 25 (OH)2: <8 pg/mL
Vitamin D3 1, 25 (OH)2: 42 pg/mL

## 2022-11-22 LAB — CYTOLOGY - PAP
Comment: NEGATIVE
Diagnosis: NEGATIVE
High risk HPV: NEGATIVE

## 2022-11-22 LAB — TSH: TSH: 2.07 m[IU]/L (ref 0.40–4.50)

## 2022-11-22 LAB — LIPID PANEL
Cholesterol: 179 mg/dL (ref <200)
HDL: 73 mg/dL (ref >=50)
LDL Cholesterol (Calc): 89 mg/dL
Non-HDL Cholesterol (Calc): 106 mg/dL (ref <130)
Total CHOL/HDL Ratio: 2.5 (calc) (ref <5.0)
Triglycerides: 83 mg/dL (ref <150)

## 2022-11-22 LAB — HEMOGLOBIN A1C
Hgb A1c MFr Bld: 6.6 %{Hb} — ABNORMAL HIGH (ref <5.7)
Mean Plasma Glucose: 143 mg/dL
eAG (mmol/L): 7.9 mmol/L

## 2022-11-22 LAB — HIV ANTIBODY (ROUTINE TESTING W REFLEX): HIV 1&2 Ab, 4th Generation: NONREACTIVE

## 2022-11-22 LAB — SURGICAL PATHOLOGY

## 2022-12-09 ENCOUNTER — Encounter: Payer: Self-pay | Admitting: Obstetrics and Gynecology

## 2022-12-09 ENCOUNTER — Ambulatory Visit: Payer: 59 | Admitting: Obstetrics and Gynecology

## 2022-12-09 VITALS — BP 120/76 | HR 73 | Temp 98.0°F | Wt 161.0 lb

## 2022-12-09 DIAGNOSIS — R309 Painful micturition, unspecified: Secondary | ICD-10-CM

## 2022-12-09 MED ORDER — CEFTRIAXONE SODIUM 1 G IJ SOLR
1.0000 g | Freq: Once | INTRAMUSCULAR | Status: AC
Start: 2022-12-09 — End: 2022-12-09
  Administered 2022-12-09: 1 g via INTRAMUSCULAR

## 2022-12-09 MED ORDER — FLUCONAZOLE 150 MG PO TABS
150.0000 mg | ORAL_TABLET | Freq: Once | ORAL | 0 refills | Status: AC
Start: 2022-12-09 — End: 2022-12-09

## 2022-12-09 NOTE — Progress Notes (Signed)
   Acute Office Visit  Subjective:    Patient ID: Dawn Villegas, female    DOB: 20-Aug-1965, 57 y.o.   MRN: [id number]  Patient presents for dysuria No fevers or upper back pain  HPI 57 y.o. presents today for Urinary Tract Infection (Uti//jj/Pt c/o burning at the end of urination & vaginal itching) .  Patient's last menstrual period was 09/12/2022 (exact date). Period Pattern: (!) Irregular  Review of Systems     Objective:    OBGyn Exam  BP 120/76   Pulse 73   Temp 98 F (36.7 C) (Oral)   Wt 161 lb (73 kg)   LMP 09/12/2022 (Exact Date)   SpO2 99%   BMI 31.18 kg/m  Wt Readings from Last 3 Encounters:  12/09/22 161 lb (73 kg)  11/18/22 161 lb (73 kg)  04/16/21 154 lb (69.9 kg)        Patient informed chaperone available to be present for breast and/or pelvic exam. Patient has requested no chaperone to be present. Patient has been advised what will be completed during breast and pelvic exam.   Assessment & Plan:  Pain with urination -     Urinalysis,Complete w/RFL Culture  Other orders -     cefTRIAXone Sodium    UC pending Diflucan to take now and repeat in 48 hours   Earley Favor

## 2022-12-09 NOTE — Progress Notes (Signed)
Patient given 1gram of rocephin. Patient waited 10 minutes with no reaction to medication.

## 2022-12-11 LAB — URINALYSIS, COMPLETE W/RFL CULTURE
Bilirubin Urine: NEGATIVE
Glucose, UA: NEGATIVE
Hgb urine dipstick: NEGATIVE
Hyaline Cast: NONE SEEN /[LPF]
Ketones, ur: NEGATIVE
Nitrites, Initial: POSITIVE — AB
Protein, ur: NEGATIVE
RBC / HPF: NONE SEEN /[HPF] (ref 0–2)
Specific Gravity, Urine: 1.01 (ref 1.001–1.035)
pH: 6 (ref 5.0–8.0)

## 2022-12-11 LAB — URINE CULTURE
MICRO NUMBER:: 15638386
SPECIMEN QUALITY:: ADEQUATE

## 2022-12-11 LAB — CULTURE INDICATED

## 2022-12-15 ENCOUNTER — Other Ambulatory Visit: Payer: Self-pay | Admitting: *Deleted

## 2022-12-15 DIAGNOSIS — D582 Other hemoglobinopathies: Secondary | ICD-10-CM

## 2022-12-15 DIAGNOSIS — R718 Other abnormality of red blood cells: Secondary | ICD-10-CM

## 2022-12-15 DIAGNOSIS — R748 Abnormal levels of other serum enzymes: Secondary | ICD-10-CM

## 2022-12-15 MED ORDER — METFORMIN HCL ER 500 MG PO TB24: 500.0000 mg | ORAL_TABLET | Freq: Every day | ORAL | 1 refills | Status: DC

## 2022-12-17 ENCOUNTER — Other Ambulatory Visit: Payer: Self-pay | Admitting: *Deleted

## 2022-12-17 MED ORDER — FLUCONAZOLE 150 MG PO TABS
150.0000 mg | ORAL_TABLET | Freq: Once | ORAL | 0 refills | Status: AC
Start: 2022-12-17 — End: 2022-12-17

## 2022-12-17 MED ORDER — CIPROFLOXACIN HCL 500 MG PO TABS
500.0000 mg | ORAL_TABLET | Freq: Two times a day (BID) | ORAL | 0 refills | Status: DC
Start: 2022-12-17 — End: 2023-01-06

## 2022-12-23 ENCOUNTER — Other Ambulatory Visit: Payer: 59

## 2023-01-06 ENCOUNTER — Ambulatory Visit (INDEPENDENT_AMBULATORY_CARE_PROVIDER_SITE_OTHER): Payer: 59 | Admitting: Obstetrics and Gynecology

## 2023-01-06 ENCOUNTER — Encounter: Payer: Self-pay | Admitting: Obstetrics and Gynecology

## 2023-01-06 ENCOUNTER — Ambulatory Visit: Payer: 59

## 2023-01-06 VITALS — BP 110/80 | HR 76

## 2023-01-06 DIAGNOSIS — N950 Postmenopausal bleeding: Secondary | ICD-10-CM

## 2023-01-06 DIAGNOSIS — Z01419 Encounter for gynecological examination (general) (routine) without abnormal findings: Secondary | ICD-10-CM

## 2023-01-06 DIAGNOSIS — Q513 Bicornate uterus: Secondary | ICD-10-CM

## 2023-01-06 DIAGNOSIS — D219 Benign neoplasm of connective and other soft tissue, unspecified: Secondary | ICD-10-CM

## 2023-01-06 NOTE — Progress Notes (Signed)
57 y.o. y.o. female here for annual exam. No LMP recorded. Patient is perimenopausal.     [id number] Married.  Husband had Prostate Cancer s/p Prostatectomy    RP: Vaginal burning before each menstrual period x almost a year   HPI: Had heavy menses monthly until LMP 04/06/2020 which was light. No breakthrough bleeding.  No pelvic pain.  Husband had prostatectomy, then penile prosthesis.  Pap Neg in 07/2019. Normal bowel movements.  Normal urine.  Breasts normal. Mammo 06/2020-ordered, Rt Dx mammo/US Benign.  BMI 29.83.  [place] 2016.  Health labs Fam MD. Baseline dxa ordered  She is here today to discuss [place] results EMB for PM bleeding with benign pathology  [place] today with uterus size 9.51 cm with anteverted uterus multiple fibroids noted largest measuring 2.8 cm.  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     04/11/2015    3:56 PM  Depression screen PHQ 2/9  Decreased Interest 0  Down, Depressed, Hopeless 0  PHQ - 2 Score 0    Blood pressure 110/80, pulse 76, SpO2 99%.     Component Value Date/Time   DIAGPAP  11/18/2022 1545    - Negative for intraepithelial lesion or malignancy (NILM)   HPVHIGH Negative 11/18/2022 1545   ADEQPAP  11/18/2022 1545    Satisfactory for evaluation; transformation zone component PRESENT.    GYN HISTORY:    Component Value Date/Time   DIAGPAP  11/18/2022 1545    - Negative for intraepithelial lesion or malignancy (NILM)   HPVHIGH Negative 11/18/2022 1545   ADEQPAP  11/18/2022 1545    Satisfactory for evaluation; transformation zone component PRESENT.    OB History  Gravida Para Term Preterm AB Living  5 4 4   1 4   SAB IAB Ectopic Multiple Live Births  1       4    # Outcome Date GA Lbr Len/2nd Weight Sex Type Anes PTL Lv  5 SAB           4 Term           3  Term           2 Term           1 Term             Past Medical History:  Diagnosis Date   Hyperlipidemia    Hypothyroidism    Insomnia    SVD (spontaneous vaginal delivery)    x 4    Past Surgical History:  Procedure Laterality Date   DILATATION & CURETTAGE/HYSTEROSCOPY WITH MYOSURE N/A 03/22/2014   Procedure: DILATATION & CURETTAGE/HYSTEROSCOPY WITH MYOSURE-RESECTOSCOPIC POLYPECTOMY;  Surgeon: Ok Edwards, MD;  Location: [facility];  Service: Gynecology;  Laterality: N/A;   DILATION AND CURETTAGE OF UTERUS     x 1 mab    Current Outpatient Medications on File Prior to Visit  Medication Sig Dispense Refill   atorvastatin (LIPITOR) 40 MG tablet Take 40 mg by mouth daily.     cholecalciferol (VITAMIN D3) 25 MCG (1000 UNIT) tablet Take 1,000 Units by mouth daily.     ibuprofen (ADVIL) 600 MG tablet Take 600 mg by mouth every 8 (eight) hours as needed.     levothyroxine (SYNTHROID) 50 MCG tablet Take 50 mcg by mouth daily before  breakfast.     Semaglutide,0.25 or 0.5MG /DOS, (OZEMPIC, 0.25 OR 0.5 MG/DOSE,) 2 MG/3ML SOPN Inject 0.25 mg subcutaneous weekly for 4 weeks after that increase to 0.5 mg subcutaneous weekly. (Patient not taking: Reported on 01/06/2023)     No current facility-administered medications on file prior to visit.    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  Tobacco Use   Smoking status: Never   Smokeless tobacco: Never  Vaping Use   Vaping status: Never Used  Substance and Sexual Activity   Alcohol use: Not Currently   Drug use: No   Sexual activity: Yes    Partners: Male    Birth control/protection: Other-see comments    Comment: 1st intercourse-17, huband prostate removed (prostate cancer)  Other Topics Concern   Not on file  Social History  Narrative   Not on file   Social Determinants of Health   Financial Resource Strain: Not on file  Food Insecurity: Not on file  Transportation Needs: Not on file  Physical Activity: Not on file  Stress: Not on file  Social Connections: Not on file  Intimate Partner Violence: Not on file    Family History  Problem Relation Age of Onset   Hypertension Mother    Hypertension Father    Diabetes Father    Heart disease Father      No Known Allergies    Patient's last menstrual period was No LMP recorded. Patient is perimenopausal..           Review of Systems Alls systems reviewed and are negative.         A:       TV US results, fibroids, bicornuate uterus, PM bleeding                             P:      Discussed endometrial and ultrasound results.  Discussed with patient risks for endometrial and uterine sarcoma in the future and missed pathology with bicornuate uterus with recurrent bleeding or enlarging fibroids in menopause.  Patient would not like to have repeat procedures or have to worry about the risks for gyn cancers in the future. :  Counseled on all options.  She would like to have the [facility].  Counseled extensively on the procedure including but not limited to what to expect and risks and benefits.  Counseled on postop care and pelvic rest for 10 weeks after the surgery with restricted lifting for 6 weeks after.  Counseled on the benefits of the robotic procedure with faster return to daily activities, improved outcomes, and less risk for complications. She would like to have this scheduled before mid January.  30 minutes spent on reviewing records, imaging,  and one on one patient time and counseling patient and documentation Dr. Karma Greaser   No follow-ups on file.  Earley Favor

## 2023-01-10 ENCOUNTER — Other Ambulatory Visit: Payer: Self-pay

## 2023-01-10 NOTE — Telephone Encounter (Signed)
Medication refill request: metformin hcl er 500mg  Last AEX:  11-18-22 Next AEX: not scheduled Last MMG (if hormonal medication request): n/a Refill authorized: It shows this was discontinued 12-15-22. Please approve or deny as appropriate

## 2023-01-11 NOTE — Telephone Encounter (Signed)
Follow up with PCP for management of this medication.

## 2023-01-11 NOTE — Telephone Encounter (Signed)
Patient is aware 

## 2023-01-12 ENCOUNTER — Encounter: Payer: Self-pay | Admitting: *Deleted

## 2023-01-17 NOTE — Progress Notes (Unsigned)
[facility] CONSULT NOTE  Patient Care Team: Andreas Blower., MD as PCP - General (Internal Medicine) Earley Favor, MD as Consulting Physician (Obstetrics and Gynecology)  ASSESSMENT & PLAN 57 y.o.female with history of hypothyroidism, hyperlipidemia referred to [facility] for elevated hemoglobin and hematocrit.  She has no particular clinical symptoms.  Reported early satiety.  History of irregular menses following OB/GYN.  Discussed obtaining testing for evaluation.  I also recommend her to get evaluated for sleep apnea as sometimes can result in elevated hemoglobin and hematocrit despite having normal weight.  CBC, CMP.  LDH JAK2 reflex EPO and ferritin  Thank you for the consult.  Follow-up in about 4 weeks to discuss results.  CHIEF COMPLAINTS/PURPOSE OF CONSULTATION:  Erythrocytosis  HISTORY OF PRESENTING ILLNESS:  Alin Kirvin 57 y.o. female is here because of elevated hemoglobin. History showed borderline elevated hemoglobin.  No: Chronic pulmonary disease unknown: Sleep apnea. She has anxiety No: Massive obesity No: Congenital or chronic heart condition No: Heavy smoking/chronic carbon monoxide poisoning No: Use of an anabolic steroids No: Use of diuretics lead to reduced plasma volume No: Hematuria No: History of heavy alcohol use or cirrhosis No: Pruritus after bathing, erythromelalgia, gout, thrombosis No: Living in high altitude  She feels well in general. No night sweats, unexpected weight loss, decreased appetite.  She has hot flashes. Report of early satiety.  MEDICAL HISTORY:  Past Medical History:  Diagnosis Date   Hyperlipidemia    Hypothyroidism    Insomnia    SVD (spontaneous vaginal delivery)    x 4    SURGICAL HISTORY: Past Surgical History:  Procedure Laterality Date   DILATATION & CURETTAGE/HYSTEROSCOPY WITH MYOSURE N/A 03/22/2014   Procedure: DILATATION & CURETTAGE/HYSTEROSCOPY WITH  MYOSURE-RESECTOSCOPIC POLYPECTOMY;  Surgeon: Ok Edwards, MD;  Location: [facility];  Service: Gynecology;  Laterality: N/A;   DILATION AND CURETTAGE OF UTERUS     x 1 mab    SOCIAL HISTORY: 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  Tobacco Use   Smoking status: Never   Smokeless tobacco: Never  Vaping Use   Vaping status: Never Used  Substance and Sexual Activity   Alcohol use: Not Currently   Drug use: No   Sexual activity: Yes    Partners: Male    Birth control/protection: Other-see comments    Comment: 1st intercourse-17, huband prostate removed (prostate cancer)  Other Topics Concern   Not on file  Social History Narrative   Not on file   Social Determinants of Health   Financial Resource Strain: Not on file  Food Insecurity: Not on file  Transportation Needs: Not on file  Physical Activity: Not on file  Stress: Not on file  Social Connections: Not on file  Intimate Partner Violence: Not on file    FAMILY HISTORY: Family History  Problem Relation Age of Onset   Hypertension Mother    Hypertension Father    Diabetes Father    Heart disease Father     ALLERGIES:  has No Known Allergies.  MEDICATIONS:  Current Outpatient Medications  Medication Sig Dispense Refill   atorvastatin (LIPITOR) 40 MG tablet Take 40 mg by mouth daily.     cholecalciferol (VITAMIN D3) 25 MCG (1000  UNIT) tablet Take 1,000 Units by mouth daily.     doxylamine, Sleep, (UNISOM) 25 MG tablet Take 25 mg by mouth at bedtime as needed.     ibuprofen (ADVIL) 600 MG tablet Take 600 mg by mouth every 8 (eight) hours as needed.     levothyroxine (SYNTHROID) 50 MCG tablet Take 50 mcg by mouth daily before breakfast.     Semaglutide,0.25 or 0.5MG /DOS, (OZEMPIC, 0.25 OR 0.5 MG/DOSE,) 2 MG/3ML  SOPN Inject 0.25 mg subcutaneous weekly for 4 weeks after that increase to 0.5 mg subcutaneous weekly. (Patient not taking: Reported on 01/06/2023)     No current facility-administered medications for this visit.    REVIEW OF SYSTEMS:   All relevant systems were reviewed with the patient and are negative.  PHYSICAL EXAMINATION: ECOG PERFORMANCE STATUS: 0 - Asymptomatic  Vitals:   01/18/23 1403  BP: 124/78  Pulse: 74  Resp: 17  Temp: 97.9 F (36.6 C)  SpO2: 100%   Filed Weights   01/18/23 1403  Weight: 158 lb 3.2 oz (71.8 kg)    GENERAL: alert, no distress and comfortable SKIN: skin color normal and no bruising or petechiae on exposed skin EYES: normal, sclera clear OROPHARYNX: no exudate  NECK: No palpable mass LYMPH:  no palpable cervical, axillary lymphadenopathy  LUNGS: clear to auscultation and percussion with normal breathing effort HEART: regular rate & rhythm and no murmurs  ABDOMEN: abdomen soft, non-tender and nondistended. Musculoskeletal: no edema  LABORATORY DATA:  I have reviewed the data as listed Recent Results (from the past 2160 hour(s))  Cytology - PAP     Status: None   Collection Time: 11/18/22  3:45 PM  Result Value Ref Range   High risk HPV Negative    Adequacy      Satisfactory for evaluation; transformation zone component PRESENT.   Diagnosis      - Negative for intraepithelial lesion or malignancy (NILM)   Comment Normal Reference Range HPV - Negative   Surgical pathology( [place]/ [facility])     Status: None   Collection Time: 11/18/22  3:46 PM  Result Value Ref Range   SURGICAL PATHOLOGY      SURGICAL PATHOLOGY CASE: [id number] PATIENT: Nalany Cashatt Surgical Pathology Report     Clinical History: PMB (cm)     FINAL MICROSCOPIC DIAGNOSIS:  A. ENDOCERVIX, POLYPECTOMY: - Benign cervical polyp - Negative for dysplasia or malignancy  B. ENDOMETRIUM, BIOPSY: - Benign inactive endometrium - Negative for  hyperplasia or malignancy      GROSS DESCRIPTION:  Specimen A: Received in formalin is a 0.6 x 0.6 x 0.2 cm tan-pink polypoid tissue which is bisected and submitted 1 block.  Specimen B: Received in formalin is a 1 x 1 x 0.25 cm aggregate of dark red soft tissue/material, entirely submitted 1 block.  SW 11/19/2022  Final Diagnosis performed by Holley Bouche, MD.   Electronically signed 11/22/2022 Technical component performed at [facility]. [facility], [address], [place], [place] [address].  Professional component performed at [facility], [address] [place], [place] [address].  Immunohistochem Presenter, broadcasting component (if applicable) was performed at [facility]. [address], [place], [place] [address].   IMMUNOHISTOCHEMISTRY DISCLAIMER (if applicable): Some of these immunohistochemical stains may have been developed and the performance characteristics determine by [facility]. Some may not have been cleared or approved by the U.S. Food and Drug Administration. The FDA has determined that such clearance or approval  is not necessary. This test is used for clinical purposes. It should not be regarded as investigational or for research. This laboratory is certified under the Clinical Laboratory Improvement Amendments of 1988 (CLIA-88) as qualified to perform high complexity clinical laboratory testing.  The controls stained appropriately.   IHC stains are performed on formalin fixed, paraffin embedded tissue using a 3,3"diaminobenzidine (DAB) chromogen and Leica Bond Autostainer System. The staining intensity of the nuc leus is score manually and is reported as the percentage of tumor cell nuclei demonstrating specific nuclear staining. The specimens are fixed in 10% Neutral Formalin for at least 6 hours and up to 72hrs. These tests are validated on decalcified tissue. Results should be  interpreted with caution given the possibility of false negative results on decalcified specimens. Antibody Clones are as follows ER-clone 37F, PR-clone 16, Ki67- clone MM1. Some of these immunohistochemical stains may have been developed and the performance characteristics determined by [facility].   Comprehensive metabolic panel     Status: Abnormal   Collection Time: 11/18/22  3:49 PM  Result Value Ref Range   Glucose, Bld 99 65 - 99 mg/dL    Comment: .            Fasting reference interval .    BUN 15 7 - 25 mg/dL   Creat 0.98 1.19 - 1.47 mg/dL   BUN/Creatinine Ratio SEE NOTE: 6 - 22 (calc)    Comment:    Not Reported: BUN and Creatinine are within    reference range. .    Sodium 141 135 - 146 mmol/L   Potassium 3.9 3.5 - 5.3 mmol/L   Chloride 100 98 - 110 mmol/L   CO2 29 20 - 32 mmol/L   Calcium 9.9 8.6 - 10.4 mg/dL   Total Protein 7.8 6.1 - 8.1 g/dL   Albumin 4.7 3.6 - 5.1 g/dL   Globulin 3.1 1.9 - 3.7 g/dL (calc)   AG Ratio 1.5 1.0 - 2.5 (calc)   Total Bilirubin 0.5 0.2 - 1.2 mg/dL   Alkaline phosphatase (APISO) 95 37 - 153 U/L   AST 37 (H) 10 - 35 U/L   ALT 58 (H) 6 - 29 U/L  CBC     Status: Abnormal   Collection Time: 11/18/22  3:49 PM  Result Value Ref Range   WBC 8.4 3.8 - 10.8 Thousand/uL   RBC 5.19 (H) 3.80 - 5.10 Million/uL   Hemoglobin 15.9 (H) 11.7 - 15.5 g/dL   HCT 82.9 (H) 56.2 - 13.0 %   MCV 91.9 80.0 - 100.0 fL   MCH 30.6 27.0 - 33.0 pg   MCHC 33.3 32.0 - 36.0 g/dL    Comment: For adults, a slight decrease in the calculated MCHC value (in the range of 30 to 32 g/dL) is most likely not clinically significant; however, it should be interpreted with caution in correlation with other red cell parameters and the patient's clinical condition.    RDW 11.9 11.0 - 15.0 %   Platelets 290 140 - 400 Thousand/uL   MPV 12.1 7.5 - 12.5 fL  Lipid panel     Status: None   Collection Time: 11/18/22  3:49 PM  Result Value Ref Range   Cholesterol 179  <200 mg/dL   HDL 73 > OR = 50 mg/dL   Triglycerides 83 <865 mg/dL   LDL Cholesterol (Calc) 89 mg/dL (calc)    Comment: Reference range: <100 . Desirable range <100 mg/dL for primary prevention;   <70 mg/dL for  patients with CHD or diabetic patients  with > or = 2 CHD risk factors. Marland Kitchen LDL-C is now calculated using the Martin-Hopkins  calculation, which is a validated novel method providing  better accuracy than the Friedewald equation in the  estimation of LDL-C.  Horald Pollen et al. Lenox Ahr. 1610;960(45): 2061-2068  (http://education.[facility].com/faq/FAQ164)    Total CHOL/HDL Ratio 2.5 <5.0 (calc)   Non-HDL Cholesterol (Calc) 106 <130 mg/dL (calc)    Comment: For patients with diabetes plus 1 major ASCVD risk  factor, treating to a non-HDL-C goal of <100 mg/dL  (LDL-C of <40 mg/dL) is considered a therapeutic  option.   TSH     Status: None   Collection Time: 11/18/22  3:49 PM  Result Value Ref Range   TSH 2.07 0.40 - 4.50 mIU/L  Hemoglobin A1c     Status: Abnormal   Collection Time: 11/18/22  3:49 PM  Result Value Ref Range   Hgb A1c MFr Bld 6.6 (H) <5.7 % of total Hgb    Comment: For someone without known diabetes, a hemoglobin A1c value of 6.5% or greater indicates that they may have  diabetes and this should be confirmed with a follow-up  test. . For someone with known diabetes, a value <7% indicates  that their diabetes is well controlled and a value  greater than or equal to 7% indicates suboptimal  control. A1c targets should be individualized based on  duration of diabetes, age, comorbid conditions, and  other considerations. . Currently, no consensus exists regarding use of hemoglobin A1c for diagnosis of diabetes for children. .    Mean Plasma Glucose 143 mg/dL   eAG (mmol/L) 7.9 mmol/L  SureSwab Advanced Vaginitis Plus,TMA     Status: None   Collection Time: 11/18/22  3:49 PM   Specimen: Vaginal Swab  Result Value Ref Range   SURESWAB(R) ADV  BACTERIAL VAGINOSIS(BV),TMA NEGATIVE NEGATIVE   CANDIDA SPECIES NOT DETECTED NOT DETECTED   Candida glabrata NOT DETECTED NOT DETECTED   TRICHOMONAS VAGINALIS (TV),TMA NOT DETECTED NOT DETECTED   C. trachomatis RNA, TMA NOT DETECTED NOT DETECTED   N. gonorrhoeae RNA, TMA NOT DETECTED NOT DETECTED    Comment: . Candida species C. albicans, C. tropicalis, C. parapsilosis, and/or C. dubliniensis can be detected, but not differentiated, in the Candida spp. result.  For additional information, please refer to https://education.[facility].com/faq/FAQ154 (This link is being provided for information/ educational purposes only.)   Vitamin D 1,25 dihydroxy     Status: None   Collection Time: 11/18/22  3:49 PM  Result Value Ref Range   Vitamin D 1, 25 (OH)2 Total 42 18 - 72 pg/mL   Vitamin D3 1, 25 (OH)2 42 pg/mL   Vitamin D2 1, 25 (OH)2 <8 pg/mL    Comment: (Note) Vitamin D3, 1,25(OH)2 indicates both endogenous  production and supplementation. Vitamin D2, 1,25(OH)2 is  an indicator of exogenous sources, such as diet or  supplementation. Interpretation and therapy are based on  measurement of Vitamin D, 1,25 (OH)2, Total. . This test was developed, and its analytical performance  characteristics have been determined by [employer]. It has not been cleared or approved by the  FDA. This assay has been validated pursuant to the CLIA  regulations and is used for clinical purposes. . For additional information, please refer to http://education.[facility].com/faq/FAQ199 (This link is being provided for  informational/educational purposes only.) . [facility] [address] [place] [address] [phone] Junita Push L. Thompson Caul, MD, PhD  HIV Antibody (routine testing w rflx)     Status: None   Collection Time: 11/18/22  3:49 PM  Result Value Ref Range   HIV 1&2 Ab, 4th Generation NON-REACTIVE NON-REACTIVE    Comment: HIV-1 antigen and  HIV-1/HIV-2 antibodies were not detected. There is no laboratory evidence of HIV infection. Marland Kitchen PLEASE NOTE: This information has been disclosed to you from records whose confidentiality may be protected by state law.  If your state requires such protection, then the state law prohibits you from making any further disclosure of the information without the specific written consent of the person to whom it pertains, or as otherwise permitted by law. A general authorization for the release of medical or other information is NOT sufficient for this purpose. . For additional information please refer to http://education.[facility].com/faq/FAQ106 (This link is being provided for informational/ educational purposes only.) . Marland Kitchen The performance of this assay has not been clinically validated in patients less than 44 years old. .   Hepatitis C Antibody     Status: None   Collection Time: 11/18/22  3:49 PM  Result Value Ref Range   Hepatitis C Ab NON-REACTIVE NON-REACTIVE    Comment: . HCV antibody was non-reactive. There is no laboratory  evidence of HCV infection. . In most cases, no further action is required. However, if recent HCV exposure is suspected, a test for HCV RNA (test code 95621) is suggested. . For additional information please refer to http://education.[facility].com/faq/FAQ22v1 (This link is being provided for informational/ educational purposes only.) .   Urinalysis,Complete w/RFL Culture     Status: Abnormal   Collection Time: 12/09/22  2:25 PM  Result Value Ref Range   Color, Urine ORANGE (A) YELLOW   APPearance CLOUDY (A) CLEAR   Specific Gravity, Urine 1.010 1.001 - 1.035   pH 6.0 5.0 - 8.0   Glucose, UA NEGATIVE NEGATIVE   Bilirubin Urine NEGATIVE NEGATIVE   Ketones, ur NEGATIVE NEGATIVE   Hgb urine dipstick NEGATIVE NEGATIVE   Protein, ur NEGATIVE NEGATIVE   Nitrites, Initial POSITIVE (A) NEGATIVE   Leukocyte Esterase 1+ (A) NEGATIVE   WBC,  UA 6-10 (A) 0 - 5 /HPF   RBC / HPF NONE SEEN 0 - 2 /HPF   Squamous Epithelial / HPF 6-10 (A) < OR = 5 /HPF   Bacteria, UA FEW (A) NONE SEEN /HPF   Hyaline Cast NONE SEEN NONE SEEN /LPF   Urine-Other      Comment: URINE CULTURE PENDING   Note      Comment: This urine was analyzed for the presence of WBC,  RBC, bacteria, casts, and other formed elements.  Only those elements seen were reported. . .   Urine Culture     Status: Abnormal   Collection Time: 12/09/22  2:25 PM  Result Value Ref Range   MICRO NUMBER: [id number]    SPECIMEN QUALITY: Adequate    Sample Source URINE    STATUS: FINAL    ISOLATE 1: Proteus mirabilis (A)     Comment: 10,000-49,000 CFU/mL of Proteus mirabilis This organism may show imipenem resistance by mechanisms other than a carbapenemase.      Susceptibility   Proteus mirabilis - URINE CULTURE, REFLEX    AMOX/CLAVULANIC <=2 Sensitive     AMPICILLIN <=2 Sensitive     AMPICILLIN/SULBACTAM <=2 Sensitive     CEFAZOLIN* <=4 Not Reportable      * For infections other than uncomplicated UTI caused by E. coli, K. pneumoniae or P. mirabilis: Cefazolin  is resistant if MIC > or = 8 mcg/mL. (Distinguishing susceptible versus intermediate for isolates with MIC < or = 4 mcg/mL requires additional testing.) For uncomplicated UTI caused by E. coli, K. pneumoniae or P. mirabilis: Cefazolin is susceptible if MIC <32 mcg/mL and predicts susceptible to the oral agents cefaclor, cefdinir, cefpodoxime, cefprozil, cefuroxime, cephalexin and loracarbef.     CEFTAZIDIME <=1 Sensitive     CEFEPIME <=1 Sensitive     CEFTRIAXONE <=1 Sensitive     CIPROFLOXACIN <=0.25 Sensitive     LEVOFLOXACIN <=0.12 Sensitive     GENTAMICIN <=1 Sensitive     IMIPENEM 2 Intermediate     NITROFURANTOIN 128 Resistant     PIP/TAZO <=4 Sensitive     TOBRAMYCIN <=1 Sensitive     TRIMETH/SULFA* <=20 Sensitive      * For infections other than uncomplicated UTI caused by E. coli, K. pneumoniae  or P. mirabilis: Cefazolin is resistant if MIC > or = 8 mcg/mL. (Distinguishing susceptible versus intermediate for isolates with MIC < or = 4 mcg/mL requires additional testing.) For uncomplicated UTI caused by E. coli, K. pneumoniae or P. mirabilis: Cefazolin is susceptible if MIC <32 mcg/mL and predicts susceptible to the oral agents cefaclor, cefdinir, cefpodoxime, cefprozil, cefuroxime, cephalexin and loracarbef. Legend: S = Susceptible  I = Intermediate R = Resistant  NS = Not susceptible SDD = Susceptible Dose Dependent * = Not Tested  NR = Not Reported **NN = See Therapy Comments   REFLEXIVE URINE CULTURE     Status: None   Collection Time: 12/09/22  2:25 PM  Result Value Ref Range   REFLEXIVE URINE CULTURE      Comment: CULTURE INDICATED - RESULTS TO FOLLOW  Ferritin     Status: None   Collection Time: 01/18/23  3:14 PM  Result Value Ref Range   Ferritin 55 11 - 307 ng/mL    Comment: Performed at [facility], [address] [place], [place], [place] [address]  Erythropoietin     Status: None   Collection Time: 01/18/23  3:15 PM  Result Value Ref Range   Erythropoietin 8.2 2.6 - 18.5 mIU/mL    Comment: (NOTE) [employer] UniCel DxI 800 Immunoassay System Values obtained with different assay methods or kits cannot be used interchangeably. Results cannot be interpreted as absolute evidence of the presence or absence of malignant disease. Performed At: [facility] [address] [place], [place] [id number] Jolene Schimke MD [id number]   CMP [facility] only)     Status: None   Collection Time: 01/18/23  3:15 PM  Result Value Ref Range   Sodium 141 135 - 145 mmol/L   Potassium 3.8 3.5 - 5.1 mmol/L   Chloride 104 98 - 111 mmol/L   CO2 29 22 - 32 mmol/L   Glucose, Bld 83 70 - 99 mg/dL    Comment: Glucose reference range applies only to samples taken after fasting for at least 8 hours.   BUN 16 6 - 20 mg/dL   Creatinine 3.08 6.57 -  1.00 mg/dL   Calcium 9.9 8.9 - 84.6 mg/dL   Total Protein 8.0 6.5 - 8.1 g/dL   Albumin 4.6 3.5 - 5.0 g/dL   AST 32 15 - 41 U/L   ALT 38 0 - 44 U/L   Alkaline Phosphatase 95 38 - 126 U/L   Total Bilirubin 0.4 <1.2 mg/dL   GFR, Estimated >96 >29 mL/min    Comment: (NOTE) Calculated using the CKD-EPI Creatinine Equation (2021)  Anion gap 8 5 - 15    Comment: Performed at [facility], [address], [place], [place] [address]  CBC with Differential (Cancer Center Only)     Status: Abnormal   Collection Time: 01/18/23  3:15 PM  Result Value Ref Range   WBC Count 8.4 4.0 - 10.5 K/uL   RBC 4.93 3.87 - 5.11 MIL/uL   Hemoglobin 15.4 (H) 12.0 - 15.0 g/dL   HCT 08.6 57.8 - 46.9 %   MCV 91.5 80.0 - 100.0 fL   MCH 31.2 26.0 - 34.0 pg   MCHC 34.1 30.0 - 36.0 g/dL   RDW 62.9 52.8 - 41.3 %   Platelet Count 280 150 - 400 K/uL   nRBC 0.0 0.0 - 0.2 %   Neutrophils Relative % 65 %   Neutro Abs 5.4 1.7 - 7.7 K/uL   Lymphocytes Relative 26 %   Lymphs Abs 2.2 0.7 - 4.0 K/uL   Monocytes Relative 6 %   Monocytes Absolute 0.5 0.1 - 1.0 K/uL   Eosinophils Relative 2 %   Eosinophils Absolute 0.2 0.0 - 0.5 K/uL   Basophils Relative 1 %   Basophils Absolute 0.1 0.0 - 0.1 K/uL   Immature Granulocytes 0 %   Abs Immature Granulocytes 0.03 0.00 - 0.07 K/uL    Comment: Performed at [facility], [address], [place], [place] [address]  Lactate dehydrogenase     Status: None   Collection Time: 01/18/23  3:16 PM  Result Value Ref Range   LDH 177 98 - 192 U/L    Comment: Performed at [facility], [address], [place], [place] [address]   All questions were answered. The patient knows to call the clinic with any problems, questions or concerns. No barriers to learning was detected.  Melven Sartorius, MD 12/5/202410:30 AM

## 2023-01-18 ENCOUNTER — Inpatient Hospital Stay: Payer: 59

## 2023-01-18 VITALS — BP 124/78 | HR 74 | Temp 97.9°F | Resp 17 | Wt 158.2 lb

## 2023-01-18 DIAGNOSIS — E039 Hypothyroidism, unspecified: Secondary | ICD-10-CM | POA: Diagnosis not present

## 2023-01-18 DIAGNOSIS — R6881 Early satiety: Secondary | ICD-10-CM | POA: Diagnosis not present

## 2023-01-18 DIAGNOSIS — E785 Hyperlipidemia, unspecified: Secondary | ICD-10-CM | POA: Diagnosis not present

## 2023-01-18 DIAGNOSIS — D751 Secondary polycythemia: Secondary | ICD-10-CM | POA: Diagnosis not present

## 2023-01-18 LAB — CBC WITH DIFFERENTIAL (CANCER CENTER ONLY)
Abs Immature Granulocytes: 0.03 10*3/uL (ref 0.00–0.07)
Basophils Absolute: 0.1 10*3/uL (ref 0.0–0.1)
Basophils Relative: 1 %
Eosinophils Absolute: 0.2 10*3/uL (ref 0.0–0.5)
Eosinophils Relative: 2 %
HCT: 45.1 % (ref 36.0–46.0)
Hemoglobin: 15.4 g/dL — ABNORMAL HIGH (ref 12.0–15.0)
Immature Granulocytes: 0 %
Lymphocytes Relative: 26 %
Lymphs Abs: 2.2 10*3/uL (ref 0.7–4.0)
MCH: 31.2 pg (ref 26.0–34.0)
MCHC: 34.1 g/dL (ref 30.0–36.0)
MCV: 91.5 fL (ref 80.0–100.0)
Monocytes Absolute: 0.5 10*3/uL (ref 0.1–1.0)
Monocytes Relative: 6 %
Neutro Abs: 5.4 10*3/uL (ref 1.7–7.7)
Neutrophils Relative %: 65 %
Platelet Count: 280 10*3/uL (ref 150–400)
RBC: 4.93 MIL/uL (ref 3.87–5.11)
RDW: 13.2 % (ref 11.5–15.5)
WBC Count: 8.4 10*3/uL (ref 4.0–10.5)
nRBC: 0 % (ref 0.0–0.2)

## 2023-01-18 LAB — CMP (CANCER CENTER ONLY)
ALT: 38 U/L (ref 0–44)
AST: 32 U/L (ref 15–41)
Albumin: 4.6 g/dL (ref 3.5–5.0)
Alkaline Phosphatase: 95 U/L (ref 38–126)
Anion gap: 8 (ref 5–15)
BUN: 16 mg/dL (ref 6–20)
CO2: 29 mmol/L (ref 22–32)
Calcium: 9.9 mg/dL (ref 8.9–10.3)
Chloride: 104 mmol/L (ref 98–111)
Creatinine: 0.73 mg/dL (ref 0.44–1.00)
GFR, Estimated: >60 mL/min (ref >60)
Glucose, Bld: 83 mg/dL (ref 70–99)
Potassium: 3.8 mmol/L (ref 3.5–5.1)
Sodium: 141 mmol/L (ref 135–145)
Total Bilirubin: 0.4 mg/dL (ref <1.2)
Total Protein: 8 g/dL (ref 6.5–8.1)

## 2023-01-18 LAB — LACTATE DEHYDROGENASE: LDH: 177 U/L (ref 98–192)

## 2023-01-19 LAB — FERRITIN: Ferritin: 55 ng/mL (ref 11–307)

## 2023-01-19 LAB — ERYTHROPOIETIN: Erythropoietin: 8.2 m[IU]/mL (ref 2.6–18.5)

## 2023-01-25 LAB — MISC LABCORP TEST (SEND OUT): Labcorp test code: 489514

## 2023-02-04 NOTE — Progress Notes (Deleted)
[facility] CONSULT NOTE  Patient Care Team: Andreas Blower., MD as PCP - General (Internal Medicine) Earley Favor, MD as Consulting Physician (Obstetrics and Gynecology)  ASSESSMENT & PLAN 57 y.o.female with history of hypothyroidism, hyperlipidemia referred to [facility] for elevated hemoglobin and hematocrit.  She has no particular clinical symptoms.  Reported early satiety.  History of irregular menses following OB/GYN. Discussed his hemoglobin level would not explain her symptoms.   Negative JAK2 mutations were not detected in exons 12, 13, 14 and 15, CALR and MPL. May discuss in upcoming visit. Polycythemia is mild and borderline. I recommend her to get evaluated for sleep apnea as sometimes can result in elevated hemoglobin and hematocrit despite having normal weight. She also has pending [place] of abdomen ordered. Recommend completed as suggested by her OB/GYN.  Follow up as needed.  CHIEF COMPLAINTS/PURPOSE OF CONSULTATION:  Erythrocytosis  HISTORY OF PRESENTING ILLNESS:  Dawn Villegas 57 y.o. female is here because of elevated hemoglobin. History showed borderline elevated hemoglobin.  She is here for follow up after recent testing.  MEDICAL HISTORY:  Past Medical History:  Diagnosis Date   Hyperlipidemia    Hypothyroidism    Insomnia    SVD (spontaneous vaginal delivery)    x 4    SURGICAL HISTORY: Past Surgical History:  Procedure Laterality Date   DILATATION & CURETTAGE/HYSTEROSCOPY WITH MYOSURE N/A 03/22/2014   Procedure: DILATATION & CURETTAGE/HYSTEROSCOPY WITH MYOSURE-RESECTOSCOPIC POLYPECTOMY;  Surgeon: Ok Edwards, MD;  Location: [facility];  Service: Gynecology;  Laterality: N/A;   DILATION AND CURETTAGE OF UTERUS     x 1 mab    SOCIAL HISTORY: Social History   Socioeconomic History   Marital status: Married    Spouse name: Not on file   Number of children: 4   Years of education: Not on file   Highest  education level: Not on file  Occupational History   Occupation: Processes orders  Tobacco Use   Smoking status: Never   Smokeless tobacco: Never  Vaping Use   Vaping status: Never Used  Substance and Sexual Activity   Alcohol use: Not Currently   Drug use: No   Sexual activity: Yes    Partners: Male    Birth control/protection: Other-see comments    Comment: 1st intercourse-17, huband prostate removed (prostate cancer)  Other Topics Concern   Not on file  Social History Narrative   Not on file   Social Drivers of Health   Financial Resource Strain: Not on file  Food Insecurity: Not on file  Transportation Needs: Not on file  Physical Activity: Not on file  Stress: Not on file  Social Connections: Not on file  Intimate Partner Violence: Not on file    FAMILY HISTORY: Family History  Problem Relation Age of Onset   Hypertension Mother    Hypertension Father    Diabetes Father    Heart disease Father     ALLERGIES:  has no known allergies.  MEDICATIONS:  Current Outpatient Medications  Medication Sig Dispense Refill   atorvastatin (LIPITOR) 40 MG tablet Take 40 mg by mouth daily.     cholecalciferol (VITAMIN D3) 25 MCG (1000 UNIT) tablet Take 1,000 Units by mouth daily.     doxylamine, Sleep, (UNISOM) 25 MG tablet Take 25 mg by mouth at bedtime as needed.     ibuprofen (ADVIL) 600 MG tablet Take 600 mg by mouth every 8 (eight) hours as needed.     levothyroxine (  SYNTHROID) 50 MCG tablet Take 50 mcg by mouth daily before breakfast.     Semaglutide,0.25 or 0.5MG /DOS, (OZEMPIC, 0.25 OR 0.5 MG/DOSE,) 2 MG/3ML SOPN Inject 0.25 mg subcutaneous weekly for 4 weeks after that increase to 0.5 mg subcutaneous weekly. (Patient not taking: Reported on 01/06/2023)     No current facility-administered medications for this visit.    REVIEW OF SYSTEMS:   All relevant systems were reviewed with the patient and are negative.  PHYSICAL EXAMINATION: ECOG PERFORMANCE STATUS: 0 -  Asymptomatic  There were no vitals filed for this visit.  There were no vitals filed for this visit.   GENERAL: alert, no distress and comfortable SKIN: skin color normal and no bruising or petechiae on exposed skin EYES: normal, sclera clear OROPHARYNX: no exudate  NECK: No palpable mass LYMPH:  no palpable cervical, axillary lymphadenopathy  LUNGS: clear to auscultation and percussion with normal breathing effort HEART: regular rate & rhythm and no murmurs  ABDOMEN: abdomen soft, non-tender and nondistended. Musculoskeletal: no edema  LABORATORY DATA:  I have reviewed the data as listed Recent Results (from the past 2160 hours)  Cytology - PAP     Status: None   Collection Time: 11/18/22  3:45 PM  Result Value Ref Range   High risk HPV Negative    Adequacy      Satisfactory for evaluation; transformation zone component PRESENT.   Diagnosis      - Negative for intraepithelial lesion or malignancy (NILM)   Comment Normal Reference Range HPV - Negative   Surgical pathology( [place]/ [facility])     Status: None   Collection Time: 11/18/22  3:46 PM  Result Value Ref Range   SURGICAL PATHOLOGY      SURGICAL PATHOLOGY CASE: [id number] PATIENT: Kalasia Rietz Surgical Pathology Report     Clinical History: PMB (cm)     FINAL MICROSCOPIC DIAGNOSIS:  A. ENDOCERVIX, POLYPECTOMY: - Benign cervical polyp - Negative for dysplasia or malignancy  B. ENDOMETRIUM, BIOPSY: - Benign inactive endometrium - Negative for hyperplasia or malignancy      GROSS DESCRIPTION:  Specimen A: Received in formalin is a 0.6 x 0.6 x 0.2 cm tan-pink polypoid tissue which is bisected and submitted 1 block.  Specimen B: Received in formalin is a 1 x 1 x 0.25 cm aggregate of dark red soft tissue/material, entirely submitted 1 block.  SW 11/19/2022  Final Diagnosis performed by Holley Bouche, MD.   Electronically signed 11/22/2022 Technical component performed at [facility]  [facility], [address], [place], [place] [address].  Professional component performed at [facility], [address] [place], [place] [address].  Immunohistochem Presenter, broadcasting component (if applicable) was performed at [facility]. [address], [place], [place] [address].   IMMUNOHISTOCHEMISTRY DISCLAIMER (if applicable): Some of these immunohistochemical stains may have been developed and the performance characteristics determine by [facility]. Some may not have been cleared or approved by the U.S. Food and Drug Administration. The FDA has determined that such clearance or approval is not necessary. This test is used for clinical purposes. It should not be regarded as investigational or for research. This laboratory is certified under the Clinical Laboratory Improvement Amendments of 1988 (CLIA-88) as qualified to perform high complexity clinical laboratory testing.  The controls stained appropriately.   IHC stains are performed on formalin fixed, paraffin embedded tissue using a 3,3"diaminobenzidine (DAB) chromogen and Leica Bond Autostainer System. The staining intensity of the nuc  leus is score manually and is reported as the percentage of tumor cell nuclei demonstrating specific nuclear staining. The specimens are fixed in 10% Neutral Formalin for at least 6 hours and up to 72hrs. These tests are validated on decalcified tissue. Results should be interpreted with caution given the possibility of false negative results on decalcified specimens. Antibody Clones are as follows ER-clone 2F, PR-clone 16, Ki67- clone MM1. Some of these immunohistochemical stains may have been developed and the performance characteristics determined by [facility].   Comprehensive metabolic panel     Status: Abnormal   Collection Time: 11/18/22  3:49 PM  Result Value Ref Range   Glucose, Bld 99 65 - 99 mg/dL     Comment: .            Fasting reference interval .    BUN 15 7 - 25 mg/dL   Creat 1.32 4.40 - 1.02 mg/dL   BUN/Creatinine Ratio SEE NOTE: 6 - 22 (calc)    Comment:    Not Reported: BUN and Creatinine are within    reference range. .    Sodium 141 135 - 146 mmol/L   Potassium 3.9 3.5 - 5.3 mmol/L   Chloride 100 98 - 110 mmol/L   CO2 29 20 - 32 mmol/L   Calcium 9.9 8.6 - 10.4 mg/dL   Total Protein 7.8 6.1 - 8.1 g/dL   Albumin 4.7 3.6 - 5.1 g/dL   Globulin 3.1 1.9 - 3.7 g/dL (calc)   AG Ratio 1.5 1.0 - 2.5 (calc)   Total Bilirubin 0.5 0.2 - 1.2 mg/dL   Alkaline phosphatase (APISO) 95 37 - 153 U/L   AST 37 (H) 10 - 35 U/L   ALT 58 (H) 6 - 29 U/L  CBC     Status: Abnormal   Collection Time: 11/18/22  3:49 PM  Result Value Ref Range   WBC 8.4 3.8 - 10.8 Thousand/uL   RBC 5.19 (H) 3.80 - 5.10 Million/uL   Hemoglobin 15.9 (H) 11.7 - 15.5 g/dL   HCT 72.5 (H) 36.6 - 44.0 %   MCV 91.9 80.0 - 100.0 fL   MCH 30.6 27.0 - 33.0 pg   MCHC 33.3 32.0 - 36.0 g/dL    Comment: For adults, a slight decrease in the calculated MCHC value (in the range of 30 to 32 g/dL) is most likely not clinically significant; however, it should be interpreted with caution in correlation with other red cell parameters and the patient's clinical condition.    RDW 11.9 11.0 - 15.0 %   Platelets 290 140 - 400 Thousand/uL   MPV 12.1 7.5 - 12.5 fL  Lipid panel     Status: None   Collection Time: 11/18/22  3:49 PM  Result Value Ref Range   Cholesterol 179 <200 mg/dL   HDL 73 > OR = 50 mg/dL   Triglycerides 83 <347 mg/dL   LDL Cholesterol (Calc) 89 mg/dL (calc)    Comment: Reference range: <100 . Desirable range <100 mg/dL for primary prevention;   <70 mg/dL for patients with CHD or diabetic patients  with > or = 2 CHD risk factors. Marland Kitchen LDL-C is now calculated using the Martin-Hopkins  calculation, which is a validated novel method providing  better accuracy than the Friedewald equation in the  estimation  of LDL-C.  Horald Pollen et al. Lenox Ahr. 4259;563(87): 2061-2068  (http://education.[facility].com/faq/FAQ164)    Total CHOL/HDL Ratio 2.5 <5.0 (calc)   Non-HDL Cholesterol (Calc) 106 <130 mg/dL (calc)  Comment: For patients with diabetes plus 1 major ASCVD risk  factor, treating to a non-HDL-C goal of <100 mg/dL  (LDL-C of <96 mg/dL) is considered a therapeutic  option.   TSH     Status: None   Collection Time: 11/18/22  3:49 PM  Result Value Ref Range   TSH 2.07 0.40 - 4.50 mIU/L  Hemoglobin A1c     Status: Abnormal   Collection Time: 11/18/22  3:49 PM  Result Value Ref Range   Hgb A1c MFr Bld 6.6 (H) <5.7 % of total Hgb    Comment: For someone without known diabetes, a hemoglobin A1c value of 6.5% or greater indicates that they may have  diabetes and this should be confirmed with a follow-up  test. . For someone with known diabetes, a value <7% indicates  that their diabetes is well controlled and a value  greater than or equal to 7% indicates suboptimal  control. A1c targets should be individualized based on  duration of diabetes, age, comorbid conditions, and  other considerations. . Currently, no consensus exists regarding use of hemoglobin A1c for diagnosis of diabetes for children. .    Mean Plasma Glucose 143 mg/dL   eAG (mmol/L) 7.9 mmol/L  SureSwab Advanced Vaginitis Plus,TMA     Status: None   Collection Time: 11/18/22  3:49 PM   Specimen: Vaginal Swab  Result Value Ref Range   SURESWAB(R) ADV BACTERIAL VAGINOSIS(BV),TMA NEGATIVE NEGATIVE   CANDIDA SPECIES NOT DETECTED NOT DETECTED   Candida glabrata NOT DETECTED NOT DETECTED   TRICHOMONAS VAGINALIS (TV),TMA NOT DETECTED NOT DETECTED   C. trachomatis RNA, TMA NOT DETECTED NOT DETECTED   N. gonorrhoeae RNA, TMA NOT DETECTED NOT DETECTED    Comment: . Candida species C. albicans, C. tropicalis, C. parapsilosis, and/or C. dubliniensis can be detected, but not differentiated, in the Candida spp. result.   For additional information, please refer to https://education.[facility].com/faq/FAQ154 (This link is being provided for information/ educational purposes only.)   Vitamin D 1,25 dihydroxy     Status: None   Collection Time: 11/18/22  3:49 PM  Result Value Ref Range   Vitamin D 1, 25 (OH)2 Total 42 18 - 72 pg/mL   Vitamin D3 1, 25 (OH)2 42 pg/mL   Vitamin D2 1, 25 (OH)2 <8 pg/mL    Comment: (Note) Vitamin D3, 1,25(OH)2 indicates both endogenous  production and supplementation. Vitamin D2, 1,25(OH)2 is  an indicator of exogenous sources, such as diet or  supplementation. Interpretation and therapy are based on  measurement of Vitamin D, 1,25 (OH)2, Total. . This test was developed, and its analytical performance  characteristics have been determined by [employer]. It has not been cleared or approved by the  FDA. This assay has been validated pursuant to the CLIA  regulations and is used for clinical purposes. . For additional information, please refer to http://education.[facility].com/faq/FAQ199 (This link is being provided for  informational/educational purposes only.) . [facility] med [facility] [address] [facility] [address] [place] [id number] [phone] Junita Push L. Frame, MD, PhD   HIV Antibody (routine testing w rflx)     Status: None   Collection Time: 11/18/22  3:49 PM  Result Value Ref Range   HIV 1&2 Ab, 4th Generation NON-REACTIVE NON-REACTIVE    Comment: HIV-1 antigen and HIV-1/HIV-2 antibodies were not detected. There is no laboratory evidence of HIV infection. Marland Kitchen PLEASE NOTE: This information has been disclosed to you from records whose confidentiality may be protected by state law.  If your state requires such protection, then the state law prohibits you from making any further disclosure of the information without the specific written consent of the person to whom it pertains, or as otherwise permitted by law. A general  authorization for the release of medical or other information is NOT sufficient for this purpose. . For additional information please refer to http://education.[facility].com/faq/FAQ106 (This link is being provided for informational/ educational purposes only.) . Marland Kitchen The performance of this assay has not been clinically validated in patients less than 56 years old. .   Hepatitis C Antibody     Status: None   Collection Time: 11/18/22  3:49 PM  Result Value Ref Range   Hepatitis C Ab NON-REACTIVE NON-REACTIVE    Comment: . HCV antibody was non-reactive. There is no laboratory  evidence of HCV infection. . In most cases, no further action is required. However, if recent HCV exposure is suspected, a test for HCV RNA (test code 29562) is suggested. . For additional information please refer to http://education.[facility].com/faq/FAQ22v1 (This link is being provided for informational/ educational purposes only.) .   Urinalysis,Complete w/RFL Culture     Status: Abnormal   Collection Time: 12/09/22  2:25 PM  Result Value Ref Range   Color, Urine ORANGE (A) YELLOW   APPearance CLOUDY (A) CLEAR   Specific Gravity, Urine 1.010 1.001 - 1.035   pH 6.0 5.0 - 8.0   Glucose, UA NEGATIVE NEGATIVE   Bilirubin Urine NEGATIVE NEGATIVE   Ketones, ur NEGATIVE NEGATIVE   Hgb urine dipstick NEGATIVE NEGATIVE   Protein, ur NEGATIVE NEGATIVE   Nitrites, Initial POSITIVE (A) NEGATIVE   Leukocyte Esterase 1+ (A) NEGATIVE   WBC, UA 6-10 (A) 0 - 5 /HPF   RBC / HPF NONE SEEN 0 - 2 /HPF   Squamous Epithelial / HPF 6-10 (A) < OR = 5 /HPF   Bacteria, UA FEW (A) NONE SEEN /HPF   Hyaline Cast NONE SEEN NONE SEEN /LPF   Urine-Other      Comment: URINE CULTURE PENDING   Note      Comment: This urine was analyzed for the presence of WBC,  RBC, bacteria, casts, and other formed elements.  Only those elements seen were reported. . .   Urine Culture     Status: Abnormal   Collection Time:  12/09/22  2:25 PM  Result Value Ref Range   MICRO NUMBER: [id number]    SPECIMEN QUALITY: Adequate    Sample Source URINE    STATUS: FINAL    ISOLATE 1: Proteus mirabilis (A)     Comment: 10,000-49,000 CFU/mL of Proteus mirabilis This organism may show imipenem resistance by mechanisms other than a carbapenemase.      Susceptibility   Proteus mirabilis - URINE CULTURE, REFLEX    AMOX/CLAVULANIC <=2 Sensitive     AMPICILLIN <=2 Sensitive     AMPICILLIN/SULBACTAM <=2 Sensitive     CEFAZOLIN* <=4 Not Reportable      * For infections other than uncomplicated UTI caused by E. coli, K. pneumoniae or P. mirabilis: Cefazolin is resistant if MIC > or = 8 mcg/mL. (Distinguishing susceptible versus intermediate for isolates with MIC < or = 4 mcg/mL requires additional testing.) For uncomplicated UTI caused by E. coli, K. pneumoniae or P. mirabilis: Cefazolin is susceptible if MIC <32 mcg/mL and predicts susceptible to the oral agents cefaclor, cefdinir, cefpodoxime, cefprozil, cefuroxime, cephalexin and loracarbef.     CEFTAZIDIME <=1 Sensitive     CEFEPIME <=1 Sensitive  CEFTRIAXONE <=1 Sensitive     CIPROFLOXACIN <=0.25 Sensitive     LEVOFLOXACIN <=0.12 Sensitive     GENTAMICIN <=1 Sensitive     IMIPENEM 2 Intermediate     NITROFURANTOIN 128 Resistant     PIP/TAZO <=4 Sensitive     TOBRAMYCIN <=1 Sensitive     TRIMETH/SULFA* <=20 Sensitive      * For infections other than uncomplicated UTI caused by E. coli, K. pneumoniae or P. mirabilis: Cefazolin is resistant if MIC > or = 8 mcg/mL. (Distinguishing susceptible versus intermediate for isolates with MIC < or = 4 mcg/mL requires additional testing.) For uncomplicated UTI caused by E. coli, K. pneumoniae or P. mirabilis: Cefazolin is susceptible if MIC <32 mcg/mL and predicts susceptible to the oral agents cefaclor, cefdinir, cefpodoxime, cefprozil, cefuroxime, cephalexin and loracarbef. Legend: S = Susceptible  I =  Intermediate R = Resistant  NS = Not susceptible SDD = Susceptible Dose Dependent * = Not Tested  NR = Not Reported **NN = See Therapy Comments   REFLEXIVE URINE CULTURE     Status: None   Collection Time: 12/09/22  2:25 PM  Result Value Ref Range   REFLEXIVE URINE CULTURE      Comment: CULTURE INDICATED - RESULTS TO FOLLOW  Miscellaneous [facility] test (send-out)     Status: None   Collection Time: 01/18/23  3:13 PM  Result Value Ref Range   [facility] test code [id number]    [facility] test name [id number]     Comment: Performed at [facility], [address] [place], [place] [address]   Misc [facility] result COMMENT     Comment: (NOTE) Test Ordered: 244010 NGS JAK2 E12-15/CALR/MPL Specimen Type                  Comment:                  YU   NOT PROVIDED E12-15 Result                  Comment                   YU   NEGATIVE JAK2 mutations were not detected in exons 12, 13, 14 and 15. This result does not rule out the presence of JAK2 mutation at a level below the detection sensitivity of this assay, the presence of other mutations outside the analyzed region of the JAK2 gene, or the presence of a myeloproliferative or other neoplasm. Result must be correlated with other clinical data for the most accurate diagnosis. CALR Result                    Comment                   YU   NEGATIVE No insertions or deletions were detected within the analyzed region of the calreticulin (CALR) gene. A negative result does not entirely exclude the possibility of a clonal population carrying CALR gene mutations that are not covered by this assay. Results should be interpreted in conjunction with clinical an d laboratory findings for the most accurate interpretation. MPL Result                     Comment                   YU   NEGATIVE No MPL mutation was identified in the provided specimen of this individual.  Results should be interpreted in conjunction with clinical and other  laboratory findings for the most accurate interpretation. E12-15/CALR/MPL Background     Comment                   TG      The JAK2 (Janus kinase 2) gene encodes for a non-receptor protein tyrosine kinase that activates cytokine and growth factor signaling. The V617F (c.1849 G>T) mutation results in constitutive activation of JAK2 and downstream STAT5 and ERK signaling. The V617F mutation is observed in approximately 95% of polycythemia vera (PV), 60% of essential thrombocythemia (ET) and primary myelofibrosis (PMF). It is  also infrequently present (3-5%) in myelodysplastic syndrome, chronic myelomonocytic leukemia, and other atypical chronic myeloid disorders. A small percentage of JAK2 mutation  positive patients (3.3%) contain other non-V617F mutations within exons 12 to 15. In particular, mutations in exon 12 of JAK2 have been described in approximately 3% of patients with PV. JAK2 allele burden  correlates with clinical phenotype, with low levels of mutant allele characterized by thrombocytosis, intermediate levels with erythrocytosis, and high mutant allele burden correlating with enhanced myelopoiesis of the BM, leukocytosis, increasing leukocytosis, increasing spleen size, and circulating CD34-positive cells.    The CALR (Calreticulin) gene encodes for a multifunctional calcium-binding protein involved in many cellular activities such as growth, proliferation, adhesion, and programmed cell death. Among patients with JAK2 negative MPNs, CALR mutations are found in approximately 70% of patients with JAK2-negative essential thrombocythemia (ET) and 60-88% of patients with JAK2-negative primary myelofibrosis(PMF). Only a minority of patients (approximately  8%) with myelodysplasia have mutations in the CALR gene. CALR mutations are rarely detected in patients with de novo acute myeloid leukemia, chronic myelogenous leukemia, lymphoid leukemia, or solid tumors. CALR mutation are  not detected in polycythemia and  generally appear to be mutually exclusive with JAK2 mutations and MPL mutations. The majority of mutational changes involve a variety of insertion or deletion mutations in exon 9 of the calreticulin gene: approximately 53% of all CALR mutations are a 52 bp deletion (type-1) while the second most prevalent mutation (approximately 32%) contains a 5 bp insertion (type-2). Other mutations (non-type 1 or type 2) are seen in a small minority of cases. CALR mutations in PMF tend to be associated with a favorable prognosis compared to JAK2 V617F mutations, whereas primary myelofibrosis negative for CALR, JAK2 V617F and MPL mutations (so-called triple negative) is associated with a poor prognosis and shorter survival.    The  MPL (myeloproliferative leukemia virus oncogene) gene encodes the thrombopoietin receptor which regulates hematopoiesis and megakaryopoiesis. Activating MPL mutations are associated with a subset of myeloproliferative neoplasms and acute megakaryoblastic leukemia. MPL W515 mutations are present in approximately 5-8% of patients with primary myelofibrosis (PMF) and 1-4% of patients with essential thrombocythemia (ET). The S505 mutation is detected in patients with hereditary thrombocythemia.    Limitations    This assay has a sensitivity of approximately 1% VAF for JAK2 V617F, 2.5% VAF for other mutations in JAK2 exons 12 to 15, CALR mutations, and MPL mutations. Deletions in CALR up to 70 bp, insertions up to 12 bp, and deletion-insertions (delins) of net length -13 to +11 have been detected in validation studies. Method                         Comment                   YU   This test was  developed and its performance characteristics determined by [facility]. It has not  been cleared or approved by the Food and Drug Administration. [employer]-based next generation sequencing. References                     Comment                   YU    Ailene Ards, Alnouri Y, Abalkhail Philbert Riser. Detection of mutations in JAK2 exons 12-15 by [employer] sequencing. Int J Lab Hematol. 2016 Feb;38(1):34-41. doi: 10.1111/ijlh.16109. Epub 2015 Sep 11. PMID: 60454098. Pura Spice, Unk Lightning, Hasserjian R, Patrica Duel, Borowitz MJ, Leroy Libman MM, [place] CD, Erhard, Vardiman JW. The 2016 revision to the World Health Organization classification of myeloid neoplasms and acute leukemia. Blood. 2016 May 19;127(20):2391-405. doi: 10.1182/blood-2016-03-643544. Epub 2016 Apr 11. PMID: 11914782. Genevie Ann [facility], Zhang ZJ, [place] S, Albitar M. Mutation profile of JAK2 transcripts in patients with chronic myeloproliferative neoplasias. J Mol Diagn. 2009 Jan;11(1):49-53.doi: 10.2353/jmoldx.2009.080114. Epub 2008 Dec 12. PMID: 95621308; PMCID: [id number].  NCCN Clinical Practice Guidelines in Oncology (NCCN Guidelines) Myeloproliferative Neoplasms Version 3.2022 - September 25, 2020. Swerdlow SH, Programmer, multimedia. WHO classification of Tumours of Haematopoietic and Lymphoid Tissues. 4th edn. Jaci Standard, [place]: Geologist, engineering for [employer] on [employer]; 2017. Tefferi A. Primary myelofibrosis: 2021 update on diagnosis, risk-stratification and management. Am J Hematol. 2021 Jan;96(1):145-162. doi: 10.1002/ajh.26050. Epub 2020 Dec 2. PMID: 52841324. Royetta Car, Kralovics R. Genetic basis and molecular pathophysiology of classical myeloproliferative neoplasms. Blood. 2017 Feb 9;129(6):667-679. doi: 10.1182/blood-2016-10-695940. Epub 2016 Dec 27. PMID: 40102725. Director Review                Comment                   Radiographer, therapeutic performed at [employer] performed by: [employer] Karilyn Cota, PhD, [facility] Director, Molecular Oncology [address] [place], [place] [address] [phone] Performed At: [facility] [address] [place], [place] [id number] Jolene Schimke MD  JJ:[id number] Performed At: [facility] [address] [place], [place] [id number] Maurine Simmering MDPhD FT:[phone] Performed At: [facility] [address] [place], [place] [id number] Maurine Simmering MDPhD TD:[phone]   Ferritin     Status: None   Collection Time: 01/18/23  3:14 PM  Result Value Ref Range   Ferritin 55 11 - 307 ng/mL    Comment: Performed at [facility], [address] [place], [place] [address]  Erythropoietin     Status: None   Collection Time: 01/18/23  3:15 PM  Result Value Ref Range   Erythropoietin 8.2 2.6 - 18.5 mIU/mL    Comment: (NOTE) [employer] UniCel DxI 800 Immunoassay System Values obtained with different assay methods or kits cannot be used interchangeably. Results cannot be interpreted as absolute evidence of the presence or absence of malignant disease. Performed At: [facility] [address] [place], [place] [id number] Jolene Schimke MD JK:[id number]   CMP [facility] only)     Status: None   Collection Time: 01/18/23  3:15 PM  Result Value Ref Range   Sodium 141 135 - 145 mmol/L   Potassium 3.8 3.5 - 5.1 mmol/L   Chloride 104 98 - 111 mmol/L   CO2 29 22 - 32 mmol/L   Glucose, Bld 83 70 - 99 mg/dL    Comment: Glucose reference range applies only to  samples taken after fasting for at least 8 hours.   BUN 16 6 - 20 mg/dL   Creatinine 6.43 3.29 - 1.00 mg/dL   Calcium 9.9 8.9 - 51.8 mg/dL   Total Protein 8.0 6.5 - 8.1 g/dL   Albumin 4.6 3.5 - 5.0 g/dL   AST 32 15 - 41 U/L   ALT 38 0 - 44 U/L   Alkaline Phosphatase 95 38 - 126 U/L   Total Bilirubin 0.4 <1.2 mg/dL   GFR, Estimated >84 >16 mL/min    Comment: (NOTE) Calculated using the CKD-EPI Creatinine Equation (2021)    Anion gap 8 5 - 15    Comment: Performed at [facility], [address], [place], [place] [address]  CBC with Differential (Cancer Center Only)     Status: Abnormal   Collection Time: 01/18/23   3:15 PM  Result Value Ref Range   WBC Count 8.4 4.0 - 10.5 K/uL   RBC 4.93 3.87 - 5.11 MIL/uL   Hemoglobin 15.4 (H) 12.0 - 15.0 g/dL   HCT 16.0 10.9 - 32.3 %   MCV 91.5 80.0 - 100.0 fL   MCH 31.2 26.0 - 34.0 pg   MCHC 34.1 30.0 - 36.0 g/dL   RDW 55.7 32.2 - 02.5 %   Platelet Count 280 150 - 400 K/uL   nRBC 0.0 0.0 - 0.2 %   Neutrophils Relative % 65 %   Neutro Abs 5.4 1.7 - 7.7 K/uL   Lymphocytes Relative 26 %   Lymphs Abs 2.2 0.7 - 4.0 K/uL   Monocytes Relative 6 %   Monocytes Absolute 0.5 0.1 - 1.0 K/uL   Eosinophils Relative 2 %   Eosinophils Absolute 0.2 0.0 - 0.5 K/uL   Basophils Relative 1 %   Basophils Absolute 0.1 0.0 - 0.1 K/uL   Immature Granulocytes 0 %   Abs Immature Granulocytes 0.03 0.00 - 0.07 K/uL    Comment: Performed at [facility], [address] [place], [place] [address]  Lactate dehydrogenase     Status: None   Collection Time: 01/18/23  3:16 PM  Result Value Ref Range   LDH 177 98 - 192 U/L    Comment: Performed at [facility], [address], [place], [place] [address]   All questions were answered. The patient knows to call the clinic with any problems, questions or concerns. No barriers to learning was detected.  Melven Sartorius, MD 12/20/20244:13 PM

## 2023-02-14 ENCOUNTER — Inpatient Hospital Stay: Payer: 59

## 2023-02-17 ENCOUNTER — Telehealth: Payer: Self-pay

## 2023-02-17 NOTE — Telephone Encounter (Signed)
 Dawn Villegas

## 2023-02-21 NOTE — Progress Notes (Signed)
 [facility] CONSULT NOTE  Patient Care Team: Delilah Murray HERO., MD as PCP - General (Internal Medicine) Glennon Almarie POUR, MD as Consulting Physician (Obstetrics and Gynecology)  ASSESSMENT & PLAN 58 y.o.female with history of hypothyroidism, hyperlipidemia referred to [facility] for elevated hemoglobin and hematocrit.   Testing showed EPO normal. JAK2 mutations were not detected in exons 12, 13, 14 and 15, CALR and MPL were negative. Polycythemia is mild and borderline. I recommend her to get evaluated for sleep apnea as sometimes can result in elevated hemoglobin and hematocrit despite having normal weight if continue to have rising hemoglobin.  Follow-up with PCP at least annually for CBC and routine exam.   Follow up as needed.  A Spanish interpreter was in the room during the visit.  CHIEF COMPLAINTS/PURPOSE OF CONSULTATION:  Erythrocytosis  HISTORY OF PRESENTING ILLNESS:  Dawn Villegas 58 y.o. female is here because of elevated hemoglobin. History showed borderline elevated hemoglobin.  She is here for follow up after recent testing.   MEDICAL HISTORY:  Past Medical History:  Diagnosis Date   Hyperlipidemia    Hypothyroidism    Insomnia    SVD (spontaneous vaginal delivery)    x 4    SURGICAL HISTORY: Past Surgical History:  Procedure Laterality Date   DILATATION & CURETTAGE/HYSTEROSCOPY WITH MYOSURE N/A 03/22/2014   Procedure: DILATATION & CURETTAGE/HYSTEROSCOPY WITH MYOSURE-RESECTOSCOPIC POLYPECTOMY;  Surgeon: Curlee VEAR Guan, MD;  Location: [facility];  Service: Gynecology;  Laterality: N/A;   DILATION AND CURETTAGE OF UTERUS     x 1 mab    SOCIAL HISTORY: 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  Tobacco Use    Smoking status: Never   Smokeless tobacco: Never  Vaping Use   Vaping status: Never Used  Substance and Sexual Activity   Alcohol use: Not Currently   Drug use: No   Sexual activity: Yes    Partners: Male    Birth control/protection: Other-see comments    Comment: 1st intercourse-17, huband prostate removed (prostate cancer)  Other Topics Concern   Not on file  Social History Narrative   Not on file   Social Drivers of Health   Financial Resource Strain: Not on file  Food Insecurity: Not on file  Transportation Needs: Not on file  Physical Activity: Not on file  Stress: Not on file  Social Connections: Not on file  Intimate Partner Violence: Not on file    FAMILY HISTORY: Family History  Problem Relation Age of Onset   Hypertension Mother    Hypertension Father    Diabetes Father    Heart disease Father     ALLERGIES:  has no known allergies.  MEDICATIONS:  Current Outpatient Medications  Medication Sig Dispense Refill   atorvastatin  (LIPITOR) 40 MG tablet Take 40 mg by mouth daily.     cholecalciferol (VITAMIN D3) 25 MCG (1000 UNIT) tablet Take 1,000 Units by mouth daily.     doxylamine, Sleep, (UNISOM) 25 MG tablet Take 25 mg by mouth at bedtime as needed.     ibuprofen  (ADVIL ) 600 MG tablet Take 600 mg by mouth every 8 (eight) hours as needed.     levothyroxine  (SYNTHROID ) 50 MCG tablet Take 50 mcg by mouth daily before  breakfast.     Semaglutide,0.25 or 0.5MG /DOS, (OZEMPIC, 0.25 OR 0.5 MG/DOSE,) 2 MG/3ML SOPN Inject 0.25 mg subcutaneous weekly for 4 weeks after that increase to 0.5 mg subcutaneous weekly. (Patient not taking: Reported on 01/06/2023)     No current facility-administered medications for this visit.    REVIEW OF SYSTEMS:   All relevant systems were reviewed with the patient and are negative.  PHYSICAL EXAMINATION: ECOG PERFORMANCE STATUS: 0 - Asymptomatic  Vitals:   02/22/23 1604  BP: 132/80  Pulse: 74  Resp: 16  Temp: 97.8 F (36.6 C)   SpO2: 100%    Filed Weights   02/22/23 1604  Weight: 159 lb (72.1 kg)     GENERAL: alert, no distress and comfortable   LABORATORY DATA:  I have reviewed the data as listed Recent Results (from the past 2160 hours)  Urinalysis,Complete w/RFL Culture     Status: Abnormal   Collection Time: 12/09/22  2:25 PM  Result Value Ref Range   Color, Urine ORANGE (A) YELLOW   APPearance CLOUDY (A) CLEAR   Specific Gravity, Urine 1.010 1.001 - 1.035   pH 6.0 5.0 - 8.0   Glucose, UA NEGATIVE NEGATIVE   Bilirubin Urine NEGATIVE NEGATIVE   Ketones, ur NEGATIVE NEGATIVE   Hgb urine dipstick NEGATIVE NEGATIVE   Protein, ur NEGATIVE NEGATIVE   Nitrites, Initial POSITIVE (A) NEGATIVE   Leukocyte Esterase 1+ (A) NEGATIVE   WBC, UA 6-10 (A) 0 - 5 /HPF   RBC / HPF NONE SEEN 0 - 2 /HPF   Squamous Epithelial / HPF 6-10 (A) < OR = 5 /HPF   Bacteria, UA FEW (A) NONE SEEN /HPF   Hyaline Cast NONE SEEN NONE SEEN /LPF   Urine-Other      Comment: URINE CULTURE PENDING   Note      Comment: This urine was analyzed for the presence of WBC,  RBC, bacteria, casts, and other formed elements.  Only those elements seen were reported. . .   Urine Culture     Status: Abnormal   Collection Time: 12/09/22  2:25 PM  Result Value Ref Range   MICRO NUMBER: [id number]    SPECIMEN QUALITY: Adequate    Sample Source URINE    STATUS: FINAL    ISOLATE 1: Proteus mirabilis (A)     Comment: 10,000-49,000 CFU/mL of Proteus mirabilis This organism may show imipenem resistance by mechanisms other than a carbapenemase.      Susceptibility   Proteus mirabilis - URINE CULTURE, REFLEX    AMOX/CLAVULANIC <=2 Sensitive     AMPICILLIN <=2 Sensitive     AMPICILLIN/SULBACTAM <=2 Sensitive     CEFAZOLIN* <=4 Not Reportable      * For infections other than uncomplicated UTI caused by E. coli, K. pneumoniae or P. mirabilis: Cefazolin is resistant if MIC > or = 8 mcg/mL. (Distinguishing susceptible versus intermediate for  isolates with MIC < or = 4 mcg/mL requires additional testing.) For uncomplicated UTI caused by E. coli, K. pneumoniae or P. mirabilis: Cefazolin is susceptible if MIC <32 mcg/mL and predicts susceptible to the oral agents cefaclor, cefdinir, cefpodoxime, cefprozil, cefuroxime , cephalexin  and loracarbef.     CEFTAZIDIME <=1 Sensitive     CEFEPIME <=1 Sensitive     CEFTRIAXONE  <=1 Sensitive     CIPROFLOXACIN  <=0.25 Sensitive     LEVOFLOXACIN <=0.12 Sensitive     GENTAMICIN <=1 Sensitive     IMIPENEM 2 Intermediate     NITROFURANTOIN  128 Resistant  PIP/TAZO <=4 Sensitive     TOBRAMYCIN <=1 Sensitive     TRIMETH/SULFA* <=20 Sensitive      * For infections other than uncomplicated UTI caused by E. coli, K. pneumoniae or P. mirabilis: Cefazolin is resistant if MIC > or = 8 mcg/mL. (Distinguishing susceptible versus intermediate for isolates with MIC < or = 4 mcg/mL requires additional testing.) For uncomplicated UTI caused by E. coli, K. pneumoniae or P. mirabilis: Cefazolin is susceptible if MIC <32 mcg/mL and predicts susceptible to the oral agents cefaclor, cefdinir, cefpodoxime, cefprozil, cefuroxime , cephalexin  and loracarbef. Legend: S = Susceptible  I = Intermediate R = Resistant  NS = Not susceptible SDD = Susceptible Dose Dependent * = Not Tested  NR = Not Reported **NN = See Therapy Comments   REFLEXIVE URINE CULTURE     Status: None   Collection Time: 12/09/22  2:25 PM  Result Value Ref Range   REFLEXIVE URINE CULTURE      Comment: CULTURE INDICATED - RESULTS TO FOLLOW  Miscellaneous [facility] test (send-out)     Status: None   Collection Time: 01/18/23  3:13 PM  Result Value Ref Range   [facility] test code [id number]    [facility] test name [id number]     Comment: Performed at [facility], [address] [place], [place] [address]   Misc [facility] result COMMENT     Comment: (NOTE) Test Ordered: [id number] NGS JAK2 E12-15/CALR/MPL Specimen Type                   Comment:                  YU   NOT PROVIDED E12-15 Result                  Comment                   YU   NEGATIVE JAK2 mutations were not detected in exons 12, 13, 14 and 15. This result does not rule out the presence of JAK2 mutation at a level below the detection sensitivity of this assay, the presence of other mutations outside the analyzed region of the JAK2 gene, or the presence of a myeloproliferative or other neoplasm. Result must be correlated with other clinical data for the most accurate diagnosis. CALR Result                    Comment                   YU   NEGATIVE No insertions or deletions were detected within the analyzed region of the calreticulin (CALR) gene. A negative result does not entirely exclude the possibility of a clonal population carrying CALR gene mutations that are not covered by this assay. Results should be interpreted in conjunction with clinical an d laboratory findings for the most accurate interpretation. MPL Result                     Comment                   YU   NEGATIVE No MPL mutation was identified in the provided specimen of this individual. Results should be interpreted in conjunction with clinical and other laboratory findings for the most accurate interpretation. E12-15/CALR/MPL Background     Comment  TG      The JAK2 (Janus kinase 2) gene encodes for a non-receptor protein tyrosine kinase that activates cytokine and growth factor signaling. The V617F (c.1849 G>T) mutation results in constitutive activation of JAK2 and downstream STAT5 and ERK signaling. The V617F mutation is observed in approximately 95% of polycythemia vera (PV), 60% of essential thrombocythemia (ET) and primary myelofibrosis (PMF). It is  also infrequently present (3-5%) in myelodysplastic syndrome, chronic myelomonocytic leukemia, and other atypical chronic myeloid disorders. A small percentage of JAK2 mutation  positive patients  (3.3%) contain other non-V617F mutations within exons 12 to 15. In particular, mutations in exon 12 of JAK2 have been described in approximately 3% of patients with PV. JAK2 allele burden  correlates with clinical phenotype, with low levels of mutant allele characterized by thrombocytosis, intermediate levels with erythrocytosis, and high mutant allele burden correlating with enhanced myelopoiesis of the BM, leukocytosis, increasing leukocytosis, increasing spleen size, and circulating CD34-positive cells.    The CALR (Calreticulin) gene encodes for a multifunctional calcium -binding protein involved in many cellular activities such as growth, proliferation, adhesion, and programmed cell death. Among patients with JAK2 negative MPNs, CALR mutations are found in approximately 70% of patients with JAK2-negative essential thrombocythemia (ET) and 60-88% of patients with JAK2-negative primary myelofibrosis(PMF). Only a minority of patients (approximately  8%) with myelodysplasia have mutations in the CALR gene. CALR mutations are rarely detected in patients with de novo acute myeloid leukemia, chronic myelogenous leukemia, lymphoid leukemia, or solid tumors. CALR mutation are not detected in polycythemia and  generally appear to be mutually exclusive with JAK2 mutations and MPL mutations. The majority of mutational changes involve a variety of insertion or deletion mutations in exon 9 of the calreticulin gene: approximately 53% of all CALR mutations are a 52 bp deletion (type-1) while the second most prevalent mutation (approximately 32%) contains a 5 bp insertion (type-2). Other mutations (non-type 1 or type 2) are seen in a small minority of cases. CALR mutations in PMF tend to be associated with a favorable prognosis compared to JAK2 V617F mutations, whereas primary myelofibrosis negative for CALR, JAK2 V617F and MPL mutations (so-called triple negative) is associated with a poor  prognosis and shorter survival.    The  MPL (myeloproliferative leukemia virus oncogene) gene encodes the thrombopoietin receptor which regulates hematopoiesis and megakaryopoiesis. Activating MPL mutations are associated with a subset of myeloproliferative neoplasms and acute megakaryoblastic leukemia. MPL W515 mutations are present in approximately 5-8% of patients with primary myelofibrosis (PMF) and 1-4% of patients with essential thrombocythemia (ET). The S505 mutation is detected in patients with hereditary thrombocythemia.    Limitations    This assay has a sensitivity of approximately 1% VAF for JAK2 V617F, 2.5% VAF for other mutations in JAK2 exons 12 to 15, CALR mutations, and MPL mutations. Deletions in CALR up to 70 bp, insertions up to 12 bp, and deletion-insertions (delins) of net length -13 to +11 have been detected in validation studies. Method                         Comment                   YU   This test was developed and its performance characteristics determined by [facility]. It has not  been cleared or approved by the Food and Drug Administration. [employer]-based next generation sequencing. References  Comment                   YU   Alghasham N, Alnouri Y, Brandon. Detection of mutations in JAK2 exons 12-15 by [employer] sequencing. Int J Lab Hematol. 2016 Feb;38(1):34-41. doi: 10.1111/ijlh.87574. Epub 2015 Sep 11. PMID: 73638915. Glorine ISLE, Cheryll LABOR, Hasserjian R, Omega PARAS, Borowitz MJ, Ladora Campanile MM, Estero CD, Cazzola M, Vardiman JW. The 2016 revision to the World Health Organization classification of myeloid neoplasms and acute leukemia. Blood. 2016 May 19;127(20):2391-405. doi: 10.1182/blood-2016-03-643544. Epub 2016 Apr 11. PMID: 72930745. Honor LELON Glennie VEAR Laurita OLEGARIO Halford [facility], Zhang ZJ, Barling S, Albitar M. Mutation profile of JAK2 transcripts in patients with chronic myeloproliferative neoplasias. J Mol Diagn.  2009 Jan;11(1):49-53.doi: 10.2353/jmoldx.2009.080114. Epub 2008 Dec 12. PMID: 80925404; PMCID: EFR7392434.  NCCN Clinical Practice Guidelines in Oncology (NCCN Guidelines) Myeloproliferative Neoplasms Version 3.2022 - September 25, 2020. Swerdlow SH, programmer, multimedia. WHO classification of Tumours of Haematopoietic and Lymphoid Tissues. 4th edn. [employer], [place]: Geologist, Engineering for [employer] on [employer]; 2017. Tefferi A. Primary myelofibrosis: 2021 update on diagnosis, risk-stratification and management. Am J Hematol. 2021 Jan;96(1):145-162. doi: 10.1002/ajh.26050. Epub 2020 Dec 2. PMID: 66802950. Vainchenker W, Kralovics R. Genetic basis and molecular pathophysiology of classical myeloproliferative neoplasms. Blood. 2017 Feb 9;129(6):667-679. doi: 10.1182/blood-2016-10-695940. Epub 2016 Dec 27. PMID: 71971970. Director Review                Comment                   Radiographer, Therapeutic performed at [employer] performed by: [employer] Rosario Flatten, PhD, [facility] Director, Molecular Oncology [address] [place], [place] [address] [phone] Performed At: [facility] [address] [place], [place] [id number] Jennette Shorter MD Ey:[phone] Performed At: [facility] [address] [place], [place] [id number] Loran Gales MDPhD Ey:[phone] Performed At: [facility] [address] [place], [place] [id number] Loran Gales MDPhD Ey:[phone]   Ferritin     Status: None   Collection Time: 01/18/23  3:14 PM  Result Value Ref Range   Ferritin 55 11 - 307 ng/mL    Comment: Performed at [facility], [address], [place], [place] [address]  Erythropoietin      Status: None   Collection Time: 01/18/23  3:15 PM  Result Value Ref Range   Erythropoietin  8.2 2.6 - 18.5 mIU/mL    Comment: (NOTE) [employer] Uni[employer]el DxI 800 Immunoassay System Values obtained with different assay methods or kits cannot be  used interchangeably. Results cannot be interpreted as absolute evidence of the presence or absence of malignant disease. Performed At: [facility] [address] [place], [place] [id number] Jennette Shorter MD Ey:[phone]   CMP [facility] only)     Status: None   Collection Time: 01/18/23  3:15 PM  Result Value Ref Range   Sodium 141 135 - 145 mmol/L   Potassium 3.8 3.5 - 5.1 mmol/L   Chloride 104 98 - 111 mmol/L   CO2 29 22 - 32 mmol/L   Glucose, Bld 83 70 - 99 mg/dL    Comment: Glucose reference range applies only to samples taken after fasting for at least 8 hours.   BUN 16 6 - 20 mg/dL   Creatinine 9.26 9.55 - 1.00 mg/dL   Calcium  9.9 8.9 - 10.3 mg/dL   Total Protein 8.0 6.5 - 8.1 g/dL   Albumin 4.6 3.5 -  5.0 g/dL   AST 32 15 - 41 U/L   ALT 38 0 - 44 U/L   Alkaline Phosphatase 95 38 - 126 U/L   Total Bilirubin 0.4 <1.2 mg/dL   GFR, Estimated >39 >39 mL/min    Comment: (NOTE) Calculated using the CKD-EPI Creatinine Equation (2021)    Anion gap 8 5 - 15    Comment: Performed at [facility], [address] [place], [place] [address]  CBC with Differential (Cancer Center Only)     Status: Abnormal   Collection Time: 01/18/23  3:15 PM  Result Value Ref Range   WBC Count 8.4 4.0 - 10.5 K/uL   RBC 4.93 3.87 - 5.11 MIL/uL   Hemoglobin 15.4 (H) 12.0 - 15.0 g/dL   HCT 54.8 63.9 - 53.9 %   MCV 91.5 80.0 - 100.0 fL   MCH 31.2 26.0 - 34.0 pg   MCHC 34.1 30.0 - 36.0 g/dL   RDW 86.7 88.4 - 84.4 %   Platelet Count 280 150 - 400 K/uL   nRBC 0.0 0.0 - 0.2 %   Neutrophils Relative % 65 %   Neutro Abs 5.4 1.7 - 7.7 K/uL   Lymphocytes Relative 26 %   Lymphs Abs 2.2 0.7 - 4.0 K/uL   Monocytes Relative 6 %   Monocytes Absolute 0.5 0.1 - 1.0 K/uL   Eosinophils Relative 2 %   Eosinophils Absolute 0.2 0.0 - 0.5 K/uL   Basophils Relative 1 %   Basophils Absolute 0.1 0.0 - 0.1 K/uL   Immature Granulocytes 0 %   Abs Immature Granulocytes 0.03  0.00 - 0.07 K/uL    Comment: Performed at [facility], [address] [place], [place] [address]  Lactate dehydrogenase     Status: None   Collection Time: 01/18/23  3:16 PM  Result Value Ref Range   LDH 177 98 - 192 U/L    Comment: Performed at [facility], [address], [place], [place] [address]    Pauletta JAYSON Chihuahua, MD 1/7/20254:16 PM

## 2023-02-22 ENCOUNTER — Other Ambulatory Visit: Payer: Self-pay

## 2023-02-22 ENCOUNTER — Inpatient Hospital Stay: Payer: 59

## 2023-02-22 ENCOUNTER — Other Ambulatory Visit: Payer: 59

## 2023-02-22 VITALS — BP 132/80 | HR 74 | Temp 97.8°F | Resp 16 | Wt 159.0 lb

## 2023-02-22 DIAGNOSIS — D751 Secondary polycythemia: Secondary | ICD-10-CM | POA: Insufficient documentation

## 2023-07-29 ENCOUNTER — Telehealth: Payer: Self-pay | Admitting: Obstetrics and Gynecology

## 2023-07-29 DIAGNOSIS — E2839 Other primary ovarian failure: Secondary | ICD-10-CM

## 2023-07-29 NOTE — Telephone Encounter (Signed)
 Bone scan reminder

## 2023-12-02 ENCOUNTER — Ambulatory Visit: Payer: Self-pay | Admitting: Obstetrics and Gynecology

## 2023-12-02 ENCOUNTER — Encounter: Payer: Self-pay | Admitting: Obstetrics and Gynecology

## 2023-12-02 ENCOUNTER — Other Ambulatory Visit (HOSPITAL_COMMUNITY)
Admission: RE | Admit: 2023-12-02 | Discharge: 2023-12-02 | Disposition: A | Source: Ambulatory Visit | Attending: Obstetrics and Gynecology | Admitting: Obstetrics and Gynecology

## 2023-12-02 ENCOUNTER — Ambulatory Visit: Admitting: Obstetrics and Gynecology

## 2023-12-02 VITALS — BP 128/80 | HR 67 | Ht 60.24 in | Wt 166.0 lb

## 2023-12-02 DIAGNOSIS — Z1331 Encounter for screening for depression: Secondary | ICD-10-CM | POA: Diagnosis not present

## 2023-12-02 DIAGNOSIS — Z1231 Encounter for screening mammogram for malignant neoplasm of breast: Secondary | ICD-10-CM

## 2023-12-02 DIAGNOSIS — Z1211 Encounter for screening for malignant neoplasm of colon: Secondary | ICD-10-CM

## 2023-12-02 DIAGNOSIS — N951 Menopausal and female climacteric states: Secondary | ICD-10-CM | POA: Diagnosis not present

## 2023-12-02 DIAGNOSIS — Z01419 Encounter for gynecological examination (general) (routine) without abnormal findings: Secondary | ICD-10-CM

## 2023-12-02 DIAGNOSIS — R350 Frequency of micturition: Secondary | ICD-10-CM

## 2023-12-02 DIAGNOSIS — N898 Other specified noninflammatory disorders of vagina: Secondary | ICD-10-CM

## 2023-12-02 DIAGNOSIS — E2839 Other primary ovarian failure: Secondary | ICD-10-CM

## 2023-12-02 MED ORDER — CLIMARA PRO 0.045-0.015 MG/DAY TD PTWK: 1.0000 | MEDICATED_PATCH | TRANSDERMAL | 12 refills | Status: DC

## 2023-12-02 MED ORDER — ESTRADIOL 0.01 % VA CREA: 1.0000 | TOPICAL_CREAM | VAGINAL | 12 refills | Status: AC

## 2023-12-02 NOTE — Patient Instructions (Signed)
 Locations you can schedule your bone scan are:  The [facility] bone scan location scheduling line is [phone]  [facility] is [phone]  [facility] radiology [phone]   [facility] [phone]  [name]: [phone]    Another option if they are behind is [facility] and their number is [phone].  I can send the referral if you want to go there.    Please let me know if you have any trouble scheduling. This is important for management of osteoporosis or osteopenia.   I can sit down with you after the scan to discuss results and treatment.  Dr. Glennon   Perimenopause/Menopause suggestions   You should be getting 1,200 mg of calcium  a day between your diet and supplements and at least 3000 IU a day of Vit D. You should exercise regularly with weight bearing exercises. I would recommend a bone density q 2 years.  We loose 5% per year in this time period of our bone density, due to the loss of estrogen. Magnesium at bedtime for our sleep and bones (follow recommended dose on bottle)  Please read The New Menopause my Dr. Ronal Stagger Haver  Try to avoid caffeine and alcohol in this period, as they can make symptoms worse  Continue annual mammograms, unless told sooner  Please reach out with any questions Dawn Villegas

## 2023-12-02 NOTE — Progress Notes (Signed)
 58 y.o. y.o. female here for annual exam. No LMP recorded. Patient is perimenopausal.  No LMP recorded. Patient is menopausal       [id number] Married.  Husband had Prostate Cancer s/p Prostatectomy they have known each other since the age of 58   No HRT but is having insomnia, fatigue, hot flashes, weight gain and is bothered by them. To begin climara pro patch with vaginal estrogen   HPI: Had heavy menses monthly until LMP 04/06/2020 which was light. No breakthrough bleeding.  No pelvic pain.  Husband had prostatectomy, then penile prosthesis.  Pap Neg in 07/2019. Normal bowel movements.  Normal urine.  Breasts normal. Mammo 5/25 MD. Baseline dxa ordered Colonoscopy 2016 referral for repeat ordered  EMB done for PM bleeding with benign pathology  US  today with uterus size 9.51 cm with anteverted uterus multiple fibroids noted largest measuring 2.8 cm.  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     12/02/2023    3:25 PM 04/11/2015    3:56 PM  Depression screen PHQ 2/9  Decreased Interest 0 0  Down, Depressed, Hopeless 1 0  PHQ - 2 Score 1 0    Blood pressure 128/80, pulse 67, height 5' 0.24 (1.53 m), weight 166 lb (75.3 kg), SpO2 98%.     Component Value Date/Time   DIAGPAP  11/18/2022 1545    - Negative for intraepithelial lesion or malignancy (NILM)   HPVHIGH Negative 11/18/2022 1545   ADEQPAP  11/18/2022 1545    Satisfactory for evaluation; transformation zone component PRESENT.    GYN HISTORY:    Component Value Date/Time   DIAGPAP  11/18/2022 1545    - Negative for intraepithelial lesion or malignancy (NILM)   HPVHIGH Negative 11/18/2022 1545   ADEQPAP  11/18/2022 1545    Satisfactory for evaluation; transformation zone component PRESENT.    OB History  Gravida Para Term Preterm AB Living  5 4 4  1  4   SAB IAB Ectopic Multiple Live Births  1    4    # Outcome Date GA Lbr Len/2nd Weight Sex Type Anes PTL Lv  5 SAB           4 Term           3 Term           2 Term           1 Term             Past Medical History:  Diagnosis Date   Hyperlipidemia    Hypothyroidism    Insomnia    SVD (spontaneous vaginal delivery)    x 4    Past Surgical History:  Procedure Laterality Date   DILATATION & CURETTAGE/HYSTEROSCOPY WITH MYOSURE N/A 03/22/2014   Procedure: DILATATION & CURETTAGE/HYSTEROSCOPY WITH MYOSURE-RESECTOSCOPIC POLYPECTOMY;  Surgeon: Curlee VEAR Guan, MD;  Location: [facility];  Service: Gynecology;  Laterality: N/A;   DILATION AND CURETTAGE OF UTERUS     x 1 mab    Current Outpatient Medications on File Prior to Visit  Medication Sig Dispense Refill   atorvastatin  (LIPITOR) 40 MG tablet Take 40 mg by mouth daily.     cholecalciferol (VITAMIN D3) 25 MCG (1000 UNIT) tablet Take 1,000 Units by mouth daily.     doxylamine, Sleep, (UNISOM) 25 MG tablet  Take 25 mg by mouth at bedtime as needed.     levothyroxine  (SYNTHROID ) 50 MCG tablet Take 50 mcg by mouth daily before breakfast.     Semaglutide,0.25 or 0.5MG /DOS, (OZEMPIC, 0.25 OR 0.5 MG/DOSE,) 2 MG/3ML SOPN Inject 0.25 mg subcutaneous weekly for 4 weeks after that increase to 0.5 mg subcutaneous weekly.     ibuprofen  (ADVIL ) 600 MG tablet Take 600 mg by mouth every 8 (eight) hours as needed. (Patient not taking: Reported on 12/02/2023)     No current facility-administered medications on file prior to visit.    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  Tobacco Use   Smoking status: Never   Smokeless tobacco: Never  Vaping Use   Vaping status: Never Used  Substance and Sexual Activity   Alcohol use: Not Currently    Drug use: No   Sexual activity: Yes    Partners: Male    Birth control/protection: Other-see comments    Comment: 1st intercourse-17, huband prostate removed (prostate cancer)  Other Topics Concern   Not on file  Social History Narrative   Not on file   Social Drivers of Health   Financial Resource Strain: Not on file  Food Insecurity: Low Risk  (04/27/2023)   Received from [facility]   Hunger Vital Sign    Within the past 12 months, you worried that your food would run out before you got money to buy more: Never true    Within the past 12 months, the food you bought just didn't last and you didn't have money to get more. : Never true  Transportation Needs: No Transportation Needs (04/27/2023)   Received from [facility]    In the past 12 months, has lack of reliable transportation kept you from medical appointments, meetings, work or from getting things needed for daily living? : No  Physical Activity: Not on file  Stress: Not on file  Social Connections: Not on file  Intimate Partner Violence: Not on file    Family History  Problem Relation Age of Onset   Hypertension Mother    Hypertension Father    Diabetes Father    Heart disease Father      No Known Allergies    Patient's last menstrual period was No LMP recorded. Patient is perimenopausal..            Review of Systems Alls systems reviewed and are negative.     Physical Exam Constitutional:      Appearance: Normal appearance.  Genitourinary:     Vulva and urethral meatus normal.     No lesions in the vagina.     Right Labia: No rash, lesions or skin changes.    Left Labia: No lesions, skin changes or rash.    No vaginal discharge or tenderness.     No vaginal prolapse present.    Moderate vaginal atrophy present.     Right Adnexa: not tender, not palpable and no mass present.    Left Adnexa: not tender, not palpable and no mass present.    No cervical motion tenderness or  discharge.     Uterus is irregular.     Uterus is not enlarged or tender.  Breasts:    Right:  Normal.     Left: Normal.  HENT:     Head: Normocephalic.  Neck:     Thyroid: No thyroid mass, thyromegaly or thyroid tenderness.  Cardiovascular:     Rate and Rhythm: Normal rate and regular rhythm.     Heart sounds: Normal heart sounds, S1 normal and S2 normal.  Pulmonary:     Effort: Pulmonary effort is normal.     Breath sounds: Normal breath sounds and air entry.  Abdominal:     General: There is no distension.     Palpations: Abdomen is soft. There is no mass.     Tenderness: There is no abdominal tenderness. There is no guarding or rebound.  Musculoskeletal:        General: Normal range of motion.     Cervical back: Full passive range of motion without pain, normal range of motion and neck supple. No tenderness.     Right lower leg: No edema.     Left lower leg: No edema.  Neurological:     Mental Status: She is alert.  Skin:    General: Skin is warm.  Psychiatric:        Mood and Affect: Mood normal.        Behavior: Behavior normal.        Thought Content: Thought content normal.  Vitals and nursing note reviewed. Exam conducted with a chaperone present.       A:         Well Woman GYN exam Bothersome vasomotor symptoms                             P:        Pap smear collected today Encouraged annual mammogram screening Colon cancer screening referral placed today due 2026 DXA ordered today Labs and immunizations to do with PMD Counseled on the r/b/a/I of HRT use.  Discussed that she will need progesterone to avoid unopposed estrogen on the endometrial lining and risk for endometrial cancer. Discussed lower risk for DVT and stroke with the patch. Side effects include risk of breast tenderness and spotting along with low risk of blood clots and stroke with uncontrolled hypertension. Counseled on the benefits to help improve the bone density, lower risk of dementia  and CAD with early use ie before age 29. To begin climara pro and encouraged patient to return if too expensive and we can work through an alternative but even estrogen patch alone about 40$ a month Encouraged healthy lifestyle practices Encouraged Vit D and Calcium    No follow-ups on file.  Dawn Villegas

## 2023-12-05 LAB — URINALYSIS, COMPLETE W/RFL CULTURE
Bilirubin Urine: NEGATIVE
Glucose, UA: NEGATIVE
Hgb urine dipstick: NEGATIVE
Hyaline Cast: NONE SEEN /LPF
Ketones, ur: NEGATIVE
Nitrites, Initial: NEGATIVE
Protein, ur: NEGATIVE
RBC / HPF: NONE SEEN /HPF (ref 0–2)
Specific Gravity, Urine: 1.02 (ref 1.001–1.035)
pH: 7 (ref 5.0–8.0)

## 2023-12-05 LAB — URINE CULTURE
MICRO NUMBER:: 17114275
SPECIMEN QUALITY:: ADEQUATE

## 2023-12-05 LAB — EXTRA SPECIMEN

## 2023-12-05 LAB — CULTURE INDICATED

## 2023-12-05 NOTE — Addendum Note (Signed)
 Addended by: GLENNON ALMARIE POUR on: 12/05/2023 01:38 PM   Modules accepted: Orders

## 2023-12-06 LAB — SURESWAB® ADVANCED VAGINITIS PLUS,TMA
C. trachomatis RNA, TMA: NOT DETECTED
CANDIDA SPECIES: NOT DETECTED
Candida glabrata: NOT DETECTED
N. gonorrhoeae RNA, TMA: NOT DETECTED
SURESWAB(R) ADV BACTERIAL VAGINOSIS(BV),TMA: POSITIVE — AB
TRICHOMONAS VAGINALIS (TV),TMA: NOT DETECTED

## 2023-12-06 LAB — CYTOLOGY - PAP: Diagnosis: NEGATIVE

## 2023-12-06 MED ORDER — NITROFURANTOIN MONOHYD MACRO 100 MG PO CAPS: 100.0000 mg | ORAL_CAPSULE | Freq: Two times a day (BID) | ORAL | 0 refills | Status: AC

## 2023-12-09 ENCOUNTER — Telehealth: Payer: Self-pay

## 2023-12-09 MED ORDER — CEPHALEXIN 500 MG PO CAPS: 500.0000 mg | ORAL_CAPSULE | Freq: Two times a day (BID) | ORAL | 0 refills | Status: AC

## 2023-12-09 NOTE — Telephone Encounter (Signed)
 Patient was prescribed macrobid  for uti with ecoli. She stated she did not have any medication allergies. Today patient calls the office & states she has been taking the medication & she now has hives on her body. She is also itching. She has taken benadryl. Patient aware to stop the medication.  Macrobid  has now been added to her allergy list. Please advise on what patient can take for the uti. Routing to Dr Glennon

## 2023-12-09 NOTE — Telephone Encounter (Signed)
 Patient notified that rx was sent to the pharmacy. Keflex 500mg  twice daily for 5 days Disp 10 refill zero. Patient stopped the macrobid .

## 2024-01-17 ENCOUNTER — Telehealth: Payer: Self-pay | Admitting: *Deleted

## 2024-01-17 NOTE — Telephone Encounter (Signed)
 Call returned to patient using [facility] Interpreter ID# [phone]. SABRALeft message to call [facility] Triage at [phone], option 4.

## 2024-01-27 NOTE — Telephone Encounter (Signed)
 Patient Dawn Villegas on Triage line returning your call.

## 2024-01-27 NOTE — Telephone Encounter (Signed)
 Call placed to patient using [facility] Interpreter ID# [phone]. Left message to call [facility] Triage at [phone], option 4.

## 2024-01-30 MED ORDER — PROGESTERONE MICRONIZED 100 MG PO CAPS: 100.0000 mg | ORAL_CAPSULE | Freq: Every evening | ORAL | 3 refills | Status: AC

## 2024-01-30 NOTE — Telephone Encounter (Signed)
 Spoke with patient using [facility] ID# [phone]. Patient has been on Climara  Pro patch for 2 months. Also uses vaginal estrogen cream. Patient states LMP 01/16/24, normal. Menses started again , has been bleeding for the past 3 days. Changes non saturated pad q6-7 times per day. Reports fatigue and headache. Denies SOB, weakness, lightheadedness or dizziness. Reports blood has a strong blood smell. Denies pain, fever/chills, N/V. No missed patches, changes weekly. Due to change today.   Last PUS 01/06/23 AEX 12/02/23  Advised patient will review with Dr. Glennon and our office will return call with recommendations. Patient agreeable. Advised Dr. Glennon is out of office this morning, will be in the PM.   Dr. Glennon -please review and advise. If work in appt needed, please advise on date/time. Please route to [facility] Triage, I will be In a class the afternoon.

## 2024-01-30 NOTE — Telephone Encounter (Signed)
 Spoke with patient using [facility] ID# [id number]. Advised per Dr. Glennon. Patient verbalizes understanding and is agreeable. Rx sent to verified pharmacy.   Patient aware to call if any new symptoms develop or if bleeding does not resolve or gets heavier.

## 2024-02-02 ENCOUNTER — Inpatient Hospital Stay (HOSPITAL_BASED_OUTPATIENT_CLINIC_OR_DEPARTMENT_OTHER)
Admission: RE | Admit: 2024-02-02 | Discharge: 2024-02-02 | Attending: Obstetrics and Gynecology | Admitting: Obstetrics and Gynecology

## 2024-02-02 DIAGNOSIS — Z01419 Encounter for gynecological examination (general) (routine) without abnormal findings: Secondary | ICD-10-CM

## 2024-02-02 DIAGNOSIS — E2839 Other primary ovarian failure: Secondary | ICD-10-CM

## 2024-02-14 ENCOUNTER — Telehealth: Payer: Self-pay

## 2024-02-14 DIAGNOSIS — Z01419 Encounter for gynecological examination (general) (routine) without abnormal findings: Secondary | ICD-10-CM

## 2024-02-14 MED ORDER — CLIMARA PRO 0.045-0.015 MG/DAY TD PTWK: 1.0000 | MEDICATED_PATCH | TRANSDERMAL | 3 refills | Status: AC

## 2024-02-14 NOTE — Telephone Encounter (Signed)
 Dr Glennon patient.  Patient called and stated that she started bleeding because on Sunday she did not have another climara  pro patch to put on. She states she has still been taking her progesterone  100mg . The pharmacy told her that she could not get it. With the [facility] interpreter [phone] on hold, I called [facility]. They stated her insurance wants her to use mail order instead. [facility]. Patient to put her patch on when she gets it. Patient to let us  know if she continues to have bleeding. Routing to [place] for review
